# Patient Record
Sex: Female | Born: 1965 | Race: White | Hispanic: No | Marital: Married | Smoking: Never smoker
Health system: Southern US, Community
[De-identification: ages and names within clinical notes are randomized; demographics above are authoritative.]

## PROBLEM LIST (undated history)

## (undated) DIAGNOSIS — I10 Essential (primary) hypertension: Secondary | ICD-10-CM

## (undated) DIAGNOSIS — E119 Type 2 diabetes mellitus without complications: Secondary | ICD-10-CM

## (undated) DIAGNOSIS — F319 Bipolar disorder, unspecified: Secondary | ICD-10-CM

## (undated) DIAGNOSIS — E785 Hyperlipidemia, unspecified: Secondary | ICD-10-CM

## (undated) DIAGNOSIS — K219 Gastro-esophageal reflux disease without esophagitis: Secondary | ICD-10-CM

## (undated) DIAGNOSIS — I639 Cerebral infarction, unspecified: Secondary | ICD-10-CM

## (undated) DIAGNOSIS — E282 Polycystic ovarian syndrome: Secondary | ICD-10-CM

## (undated) HISTORY — DX: Bipolar disorder, unspecified: F31.9

## (undated) HISTORY — DX: Essential (primary) hypertension: I10

## (undated) HISTORY — DX: Polycystic ovarian syndrome: E28.2

## (undated) HISTORY — PX: ABDOMINAL HYSTERECTOMY: SHX81

## (undated) HISTORY — PX: LAPAROTOMY: SHX154

## (undated) HISTORY — DX: Type 2 diabetes mellitus without complications: E11.9

## (undated) HISTORY — DX: Gastro-esophageal reflux disease without esophagitis: K21.9

## (undated) HISTORY — DX: Cerebral infarction, unspecified: I63.9

## (undated) HISTORY — DX: Hyperlipidemia, unspecified: E78.5

---

## 2007-11-12 LAB — HM MAMMOGRAPHY: HM Mammogram: NORMAL

## 2010-01-16 ENCOUNTER — Ambulatory Visit: Payer: Self-pay | Admitting: Family Medicine

## 2010-01-16 DIAGNOSIS — E785 Hyperlipidemia, unspecified: Secondary | ICD-10-CM

## 2010-01-16 DIAGNOSIS — I10 Essential (primary) hypertension: Secondary | ICD-10-CM | POA: Insufficient documentation

## 2010-01-16 DIAGNOSIS — F319 Bipolar disorder, unspecified: Secondary | ICD-10-CM

## 2010-02-05 ENCOUNTER — Encounter: Payer: Self-pay | Admitting: Family Medicine

## 2010-02-13 ENCOUNTER — Ambulatory Visit (HOSPITAL_COMMUNITY): Payer: Self-pay | Admitting: Licensed Clinical Social Worker

## 2010-02-27 ENCOUNTER — Ambulatory Visit: Payer: Self-pay | Admitting: Family Medicine

## 2010-02-27 DIAGNOSIS — K219 Gastro-esophageal reflux disease without esophagitis: Secondary | ICD-10-CM

## 2010-02-27 DIAGNOSIS — E538 Deficiency of other specified B group vitamins: Secondary | ICD-10-CM

## 2010-03-04 ENCOUNTER — Ambulatory Visit: Payer: Self-pay | Admitting: Family Medicine

## 2010-03-05 ENCOUNTER — Telehealth: Payer: Self-pay | Admitting: Family Medicine

## 2010-03-05 ENCOUNTER — Encounter: Payer: Self-pay | Admitting: Family Medicine

## 2010-03-05 LAB — CONVERTED CEMR LAB
Eosinophils Absolute: 0.3 10*3/uL (ref 0.0–0.7)
Eosinophils Relative: 3 % (ref 0–5)
Hemoglobin: 10.8 g/dL — ABNORMAL LOW (ref 12.0–15.0)
Lymphocytes Relative: 20 % (ref 12–46)
Lymphs Abs: 1.9 10*3/uL (ref 0.7–4.0)
MCHC: 30.7 g/dL (ref 30.0–36.0)
Monocytes Absolute: 1 10*3/uL (ref 0.1–1.0)
Monocytes Relative: 10 % (ref 3–12)
RDW: 14.5 % (ref 11.5–15.5)

## 2010-03-11 ENCOUNTER — Ambulatory Visit: Payer: Self-pay | Admitting: Obstetrics & Gynecology

## 2010-03-11 ENCOUNTER — Ambulatory Visit: Payer: Self-pay | Admitting: Family Medicine

## 2010-03-11 ENCOUNTER — Telehealth: Payer: Self-pay | Admitting: Family Medicine

## 2010-03-12 ENCOUNTER — Encounter: Payer: Self-pay | Admitting: Family Medicine

## 2010-03-13 ENCOUNTER — Ambulatory Visit (HOSPITAL_COMMUNITY): Payer: Self-pay | Admitting: Licensed Clinical Social Worker

## 2010-03-16 ENCOUNTER — Encounter: Admission: RE | Admit: 2010-03-16 | Discharge: 2010-03-16 | Payer: Self-pay | Admitting: Obstetrics & Gynecology

## 2010-03-20 ENCOUNTER — Ambulatory Visit (HOSPITAL_COMMUNITY): Payer: Self-pay | Admitting: Licensed Clinical Social Worker

## 2010-03-25 ENCOUNTER — Ambulatory Visit: Payer: Self-pay | Admitting: Family Medicine

## 2010-03-25 ENCOUNTER — Ambulatory Visit: Payer: Self-pay | Admitting: Obstetrics & Gynecology

## 2010-03-27 ENCOUNTER — Ambulatory Visit (HOSPITAL_COMMUNITY): Payer: Self-pay | Admitting: Licensed Clinical Social Worker

## 2010-04-03 ENCOUNTER — Ambulatory Visit (HOSPITAL_COMMUNITY): Payer: Self-pay | Admitting: Licensed Clinical Social Worker

## 2010-04-09 ENCOUNTER — Ambulatory Visit: Payer: Self-pay | Admitting: Family Medicine

## 2010-04-10 ENCOUNTER — Ambulatory Visit (HOSPITAL_COMMUNITY): Payer: Self-pay | Admitting: Licensed Clinical Social Worker

## 2010-04-15 ENCOUNTER — Encounter: Payer: Self-pay | Admitting: Family Medicine

## 2010-04-15 ENCOUNTER — Ambulatory Visit: Payer: Self-pay | Admitting: Obstetrics & Gynecology

## 2010-04-16 LAB — CONVERTED CEMR LAB
ALT: 30 units/L (ref 0–35)
Albumin: 4.3 g/dL (ref 3.5–5.2)
Alkaline Phosphatase: 178 units/L — ABNORMAL HIGH (ref 39–117)
BUN: 9 mg/dL (ref 6–23)
Calcium: 9.3 mg/dL (ref 8.4–10.5)
Cholesterol: 267 mg/dL — ABNORMAL HIGH (ref 0–200)
Creatinine, Ser: 0.68 mg/dL (ref 0.40–1.20)
Glucose, Bld: 170 mg/dL — ABNORMAL HIGH (ref 70–99)
HCT: 37.7 % (ref 36.0–46.0)
MCHC: 30.8 g/dL (ref 30.0–36.0)
Platelets: 358 10*3/uL (ref 150–400)
Potassium: 3.6 meq/L (ref 3.5–5.3)
RBC: 4.57 M/uL (ref 3.87–5.11)
TIBC: 436 ug/dL (ref 250–470)
TSH: 3.619 microintl units/mL (ref 0.350–4.500)
Total Bilirubin: 0.4 mg/dL (ref 0.3–1.2)
Total CHOL/HDL Ratio: 6.8
Triglycerides: 549 mg/dL — ABNORMAL HIGH (ref <150)
UIBC: 397 ug/dL

## 2010-04-20 ENCOUNTER — Ambulatory Visit (HOSPITAL_COMMUNITY): Payer: Self-pay | Admitting: Psychiatry

## 2010-04-27 ENCOUNTER — Ambulatory Visit: Payer: Self-pay | Admitting: Family Medicine

## 2010-04-28 ENCOUNTER — Encounter: Payer: Self-pay | Admitting: Family Medicine

## 2010-04-28 LAB — CONVERTED CEMR LAB
AST: 48 units/L — ABNORMAL HIGH (ref 0–37)
Alkaline Phosphatase: 185 units/L — ABNORMAL HIGH (ref 39–117)
BUN: 10 mg/dL (ref 6–23)
CO2: 23 meq/L (ref 19–32)
Calcium: 10.1 mg/dL (ref 8.4–10.5)
Chloride: 96 meq/L (ref 96–112)
Glucose, Bld: 154 mg/dL — ABNORMAL HIGH (ref 70–99)
HCT: 40.1 % (ref 36.0–46.0)
Lymphocytes Relative: 17 % (ref 12–46)
Neutrophils Relative %: 72 % (ref 43–77)
Platelets: 359 10*3/uL (ref 150–400)
Potassium: 3.7 meq/L (ref 3.5–5.3)
RBC: 4.82 M/uL (ref 3.87–5.11)
RDW: 15.7 % — ABNORMAL HIGH (ref 11.5–15.5)
Sodium: 137 meq/L (ref 135–145)
Vitamin B-12: 1331 pg/mL — ABNORMAL HIGH (ref 211–911)
WBC: 12.2 10*3/uL — ABNORMAL HIGH (ref 4.0–10.5)

## 2010-04-30 LAB — CONVERTED CEMR LAB
Hep B C IgM: NEGATIVE
Hepatitis B Surface Ag: NEGATIVE
Hgb A1c MFr Bld: 6.9 % — ABNORMAL HIGH (ref <5.7)

## 2010-05-01 ENCOUNTER — Ambulatory Visit (HOSPITAL_COMMUNITY): Payer: Self-pay | Admitting: Licensed Clinical Social Worker

## 2010-05-04 ENCOUNTER — Ambulatory Visit: Payer: Self-pay | Admitting: Obstetrics & Gynecology

## 2010-05-04 ENCOUNTER — Ambulatory Visit (HOSPITAL_COMMUNITY): Admission: RE | Admit: 2010-05-04 | Discharge: 2010-05-05 | Payer: Self-pay | Admitting: Obstetrics & Gynecology

## 2010-05-08 ENCOUNTER — Ambulatory Visit (HOSPITAL_COMMUNITY): Payer: Self-pay | Admitting: Licensed Clinical Social Worker

## 2010-05-08 ENCOUNTER — Ambulatory Visit: Payer: Self-pay | Admitting: Family Medicine

## 2010-05-08 DIAGNOSIS — E119 Type 2 diabetes mellitus without complications: Secondary | ICD-10-CM

## 2010-05-08 DIAGNOSIS — R740 Nonspecific elevation of levels of transaminase and lactic acid dehydrogenase [LDH]: Secondary | ICD-10-CM

## 2010-05-08 LAB — CONVERTED CEMR LAB: LDL Goal: 100 mg/dL

## 2010-05-11 LAB — CONVERTED CEMR LAB
ALT: 37 units/L — ABNORMAL HIGH (ref 0–35)
AST: 43 units/L — ABNORMAL HIGH (ref 0–37)
Bilirubin, Direct: 0.1 mg/dL (ref 0.0–0.3)
Indirect Bilirubin: 0.4 mg/dL (ref 0.0–0.9)
Total Bilirubin: 0.5 mg/dL (ref 0.3–1.2)

## 2010-05-12 ENCOUNTER — Encounter: Admission: RE | Admit: 2010-05-12 | Discharge: 2010-05-12 | Payer: Self-pay | Admitting: Family Medicine

## 2010-05-12 ENCOUNTER — Telehealth: Payer: Self-pay | Admitting: Family Medicine

## 2010-05-13 ENCOUNTER — Telehealth: Payer: Self-pay | Admitting: Family Medicine

## 2010-05-22 ENCOUNTER — Telehealth: Payer: Self-pay | Admitting: Family Medicine

## 2010-05-26 ENCOUNTER — Telehealth: Payer: Self-pay | Admitting: Family Medicine

## 2010-05-29 ENCOUNTER — Telehealth: Payer: Self-pay | Admitting: Family Medicine

## 2010-05-29 ENCOUNTER — Ambulatory Visit: Payer: Self-pay | Admitting: Family Medicine

## 2010-05-29 DIAGNOSIS — L270 Generalized skin eruption due to drugs and medicaments taken internally: Secondary | ICD-10-CM | POA: Insufficient documentation

## 2010-06-09 ENCOUNTER — Telehealth: Payer: Self-pay | Admitting: Family Medicine

## 2010-06-09 ENCOUNTER — Ambulatory Visit: Payer: Self-pay | Admitting: Family Medicine

## 2010-06-10 ENCOUNTER — Encounter: Payer: Self-pay | Admitting: Family Medicine

## 2010-06-12 ENCOUNTER — Ambulatory Visit (HOSPITAL_COMMUNITY): Payer: Self-pay | Admitting: Psychiatry

## 2010-06-23 ENCOUNTER — Telehealth: Payer: Self-pay | Admitting: Family Medicine

## 2010-09-25 ENCOUNTER — Ambulatory Visit
Admission: RE | Admit: 2010-09-25 | Discharge: 2010-09-25 | Payer: Self-pay | Source: Home / Self Care | Attending: Family Medicine | Admitting: Family Medicine

## 2010-09-25 ENCOUNTER — Encounter
Admission: RE | Admit: 2010-09-25 | Discharge: 2010-09-25 | Payer: Self-pay | Source: Home / Self Care | Attending: Family Medicine | Admitting: Family Medicine

## 2010-09-25 DIAGNOSIS — M25519 Pain in unspecified shoulder: Secondary | ICD-10-CM | POA: Insufficient documentation

## 2010-09-25 DIAGNOSIS — R209 Unspecified disturbances of skin sensation: Secondary | ICD-10-CM | POA: Insufficient documentation

## 2010-09-28 ENCOUNTER — Telehealth: Payer: Self-pay | Admitting: Family Medicine

## 2010-10-02 ENCOUNTER — Ambulatory Visit (HOSPITAL_COMMUNITY)
Admission: RE | Admit: 2010-10-02 | Discharge: 2010-10-02 | Payer: Self-pay | Source: Home / Self Care | Attending: Psychiatry | Admitting: Psychiatry

## 2010-10-13 ENCOUNTER — Telehealth: Payer: Self-pay | Admitting: Family Medicine

## 2010-10-17 ENCOUNTER — Encounter: Admission: RE | Admit: 2010-10-17 | Payer: Self-pay | Source: Home / Self Care | Admitting: Family Medicine

## 2010-10-20 NOTE — Assessment & Plan Note (Signed)
Summary: 2 mo. f/u DM, liver   Vital Signs:  Patient profile:   45 year old female Height:      62.75 inches Weight:      203 pounds Pulse rate:   69 / minute BP sitting:   139 / 88  (left arm) Cuff size:   regular  Vitals Entered By: Avon Gully CMA, Duncan Dull) (May 08, 2010 2:14 PM) CC: f/u labs. , Lipid Management   Primary Care Provider:  Nani Gasser MD  CC:  f/u labs.  and Lipid Management.  History of Present Illness: Bought her own meter and started testing.  Noticed fasting in the AM is usually below 170 but occ low 200s.  After eats tends to dip and then 2 hours later it is really high. Has breen limiting her carbs even with this.Would feel nauseated when wakes up in the AM.  Had a couple of sugars in the 350 range.     Occ will get a apinch or stich on her side on her right flank near the RUQ.  Usually pian is breif. Has noticed some bloating. Did have her endometrial ablation 4 days ago and this went well.  Started after she found out her liver enzymes were slightly elevated.   Lipid Management History:      Positive NCEP/ATP III risk factors include diabetes, HDL cholesterol less than 40, and hypertension.  Negative NCEP/ATP III risk factors include female age less than 35 years old and non-tobacco-user status.    Current Medications (verified): 1)  Lamotrigine 100 Mg Tabs (Lamotrigine) .... Take 1 Tab By Mouth Two Times A Day 2)  Lorazepam 1 Mg Tabs (Lorazepam) .... Take 1-2 Tabs By Mouth At Bedtime 3)  Oxcarbazepine 300 Mg Tabs (Oxcarbazepine) .... Take 1 Tab By Mouth Two Times A Day 4)  Abilify 10 Mg Tabs (Aripiprazole) .... Take 1 Tab By Mouth Once Daily 5)  Melatonin 5 Mg Tabs (Melatonin) .... Take 2 Tabs By Mouth At Bedtime 6)  Norvasc 10 Mg Tabs (Amlodipine Besylate) .Marland Kitchen.. 1 Tab By Mouth Once A Day 7)  Prilosec 20 Mg Cpdr (Omeprazole) .... Take One Tablet By Mouth Twice A Day 8)  Maxzide-25 37.5-25 Mg Tabs (Triamterene-Hctz) .Marland Kitchen.. 1 Tab By Mouth  Daily 9)  Metoprolol Succinate 50 Mg Xr24h-Tab (Metoprolol Succinate) .Marland Kitchen.. 1 Tab By Mouth Daily 10)  Vitamin B-12 2500 Mcg Subl (Cyanocobalamin) .... Take One Tabelt By Mouth Once A Day  Allergies (verified): No Known Drug Allergies  Comments:  Nurse/Medical Assistant: The patient's medications and allergies were reviewed with the patient and were updated in the Medication and Allergy Lists. Avon Gully CMA, Duncan Dull) (May 08, 2010 2:24 PM)  Physical Exam  General:  Well-developed,well-nourished,in no acute distress; alert,appropriate and cooperative throughout examination Lungs:  Normal respiratory effort, chest expands symmetrically. Lungs are clear to auscultation, no crackles or wheezes. Heart:  Normal rate and regular rhythm. S1 and S2 normal without gallop, murmur, click, rub or other extra sounds.   Impression & Recommendations:  Problem # 1:  DIABETES MELLITUS, CONTROLLED (ICD-250.00) Assessment New  Discussed new dx. Discussed medications and potential SE.  F/U in 3-4 weeks. Will refer for counseling.  Recheck microalbumin once A1C is under control.  Can discuss vaccination etc at the next visit.   Her updated medication list for this problem includes:    Glucophage 500 Mg Tabs (Metformin hcl) .Marland Kitchen... Take 1 tablet by mouth two times a day  Orders: Nutrition Referral (Nutrition) Creatinine  ([address]) Urine  Microalbumin (04540)  Problem # 2:  TRANSAMINASES, SERUM, ELEVATED (ICD-790.4) Assessment: New  Acute Hepal panel neg. Likely from fatty liver but will recheck toay and if still elevated consider [place] imagian of the liver.  Can also assess the GB at that time.   Orders: T-Hepatic Function [phone])  Complete Medication List: 1)  Lamotrigine 100 Mg Tabs (Lamotrigine) .... Take 1 tab by mouth two times a day 2)  Lorazepam 1 Mg Tabs (Lorazepam) .... Take 1-2 tabs by mouth at bedtime 3)  Oxcarbazepine 300 Mg Tabs (Oxcarbazepine) .... Take 1 tab by mouth two  times a day 4)  Abilify 10 Mg Tabs (Aripiprazole) .... Take 1 tab by mouth once daily 5)  Melatonin 5 Mg Tabs (Melatonin) .... Take 2 tabs by mouth at bedtime 6)  Norvasc 10 Mg Tabs (Amlodipine besylate) .Marland Kitchen.. 1 tab by mouth once a day 7)  Prilosec 20 Mg Cpdr (Omeprazole) .... Take one tablet by mouth twice a day 8)  Maxzide-25 37.5-25 Mg Tabs (Triamterene-hctz) .Marland Kitchen.. 1 tab by mouth daily 9)  Metoprolol Succinate 50 Mg Xr24h-tab (Metoprolol succinate) .Marland Kitchen.. 1 tab by mouth daily 10)  Vitamin B-12 2500 Mcg Subl (Cyanocobalamin) .... Take one tabelt by mouth once a day 11)  Glucophage 500 Mg Tabs (Metformin hcl) .... Take 1 tablet by mouth two times a day  Lipid Assessment/Plan:      Based on NCEP/ATP III, the patient's risk factor category is "history of diabetes".  The patient's lipid goals are as follows: Total cholesterol goal is 200; LDL cholesterol goal is 100; HDL cholesterol goal is 40; Triglyceride goal is 150.     Patient Instructions: 1)  We will refer you for diabetic counseling  2)  F/U in 3-4 weeks.  Prescriptions: HYDROCODONE-ACETAMINOPHEN 5-325 MG TABS (HYDROCODONE-ACETAMINOPHEN) 1-2 tabs every 4-6 hours as needed  #10 x 0   Entered and Authorized by:   Nani Gasser MD   Signed by:   Nani Gasser MD on 05/08/2010   Method used:   Print then Give to Patient   RxID:   [id number] GLUCOPHAGE 500 MG TABS (METFORMIN HCL) Take 1 tablet by mouth two times a day  #60 x 0   Entered and Authorized by:   Nani Gasser MD   Signed by:   Nani Gasser MD on 05/08/2010   Method used:   Electronically to        [facility]* (retail)       [address] [place], [place]  [address]       Ph: [phone]       Fax: [phone]   RxID:   [id number]   Laboratory Results   Urine Tests  Date/Time Received: 05/08/10 Date/Time Reported: 05/08/10  Microalbumin (urine): 150 mg/L Creatinine: 300 mg/dL  A:C Ratio 29-$[id number]$ /g    The hydrocodone  above was in error and was canceld. August 19, 20116:57 PM Metheney MD, Santina Evans

## 2010-10-20 NOTE — Assessment & Plan Note (Signed)
Summary: NURSE--BP  Nurse Visit   Vital Signs:  Patient profile:   45 year old female Height:      62.75 inches Weight:      217 pounds Pulse rate:   94 / minute BP sitting:   177 / 115  (left arm) Cuff size:   large  Vitals Entered By: Kathlene November (March 11, 2010 1:07 PM) CC: Follow-up HTN HPI: Taking Meds?no Side Effects?no Chest Pain, SOB, Dizziness?no A/P: HTN(401.1) At Goal? If no, MD will be notified. Follow-up in--  5 minutes was spent with the pt.  Allergies: No Known Drug Allergies  Orders Added: 1)  Est. Patient Level I [[address]] Prescriptions: MAXZIDE-25 37.5-25 MG TABS (TRIAMTERENE-HCTZ) 1 tab by mouth daily  #30 x 2   Entered and Authorized by:   Seymour Bars DO   Signed by:   Seymour Bars DO on 03/11/2010   Method used:   Electronically to        [facility]* (retail)       [address] [place], [place]  [address]       Ph: [phone]       Fax: [phone]   RxID:   [id number] NORVASC 10 MG TABS (AMLODIPINE BESYLATE) 1 tab by mouth once a day  #30 x 2   Entered and Authorized by:   Seymour Bars DO   Signed by:   Seymour Bars DO on 03/11/2010   Method used:   Electronically to        [facility]* (retail)       [address] [place], [place]  [address]       Ph: [phone]       Fax: [phone]   RxID:   [id number]     Patient Instructions: 1)  Off Tribenzor, BP is HIGH. 2)  Will change to MAXZIDE once a day + AMLODOPINE once a day. 3)  Return for a nurse visit BP check in 2 wks. 4)  STOP POTASSIUM PILLS!

## 2010-10-20 NOTE — Assessment & Plan Note (Signed)
Summary: Rash   Vital Signs:  Patient profile:   45 year old female Height:      62.75 inches Weight:      202 pounds Pulse rate:   90 / minute BP sitting:   140 / 96  (right arm) Cuff size:   regular  Vitals Entered By: Avon Gully CMA, Duncan Dull) (May 29, 2010 3:37 PM) CC: f/u DM, stopped the [place] because pt started itching,still itching all over   Primary Care Provider:  Nani Gasser MD  CC:  f/u DM, stopped the [place] because pt started itching, and still itching all over.  History of Present Illness: f/u DM, stopped the [place] because pt started itching, till itching all over. Started about a week after started the Januvia.  Also started her on zoloft on 05-06-2010.  Off the Januvia for 7 days.  Getting a red raised rash on her abdomen is very itchy. Using antihistamine -does help but as soon as wears off it starts again.  She is taking Victoza in the morning.  No SOB or wheezing. Pt 's husband took a photo that she brings in with her today.   Current Medications (verified): 1)  Lamotrigine 100 Mg Tabs (Lamotrigine) .... Take 1 Tab By Mouth Two Times A Day 2)  Lorazepam 1 Mg Tabs (Lorazepam) .... Take 1-2 Tabs By Mouth At Bedtime 3)  Oxcarbazepine 300 Mg Tabs (Oxcarbazepine) .... Take 1 Tab By Mouth Two Times A Day 4)  Melatonin 5 Mg Tabs (Melatonin) .... Take 2 Tabs By Mouth At Bedtime 5)  Norvasc 10 Mg Tabs (Amlodipine Besylate) .Marland Kitchen.. 1 Tab By Mouth Once A Day 6)  Prilosec 20 Mg Cpdr (Omeprazole) .... Take One Tablet By Mouth Twice A Day 7)  Maxzide-25 37.5-25 Mg Tabs (Triamterene-Hctz) .Marland Kitchen.. 1 Tab By Mouth Daily 8)  Metoprolol Succinate 50 Mg Xr24h-Tab (Metoprolol Succinate) .Marland Kitchen.. 1 Tab By Mouth Daily 9)  Victoza 18 Mg/31ml Soln (Liraglutide) .... 0.6 Mg Subcutaneously Daily X 1 Week, Then Incerase To 1.2 Mg Subcutaneously Daily 10)  Zoloft 25 Mg Tabs (Sertraline Hcl) .... One Tablet By Mouth Once A Day  Allergies (verified): 1)  ! Metformin Hcl (Metformin  Hcl)  Comments:  Nurse/Medical Assistant: The patient's medications and allergies were reviewed with the patient and were updated in the Medication and Allergy Lists. Avon Gully CMA, Duncan Dull) (May 29, 2010 3:39 PM)  Physical Exam  General:  Well-developed,well-nourished,in no acute distress; alert,appropriate and cooperative throughout examination Lungs:  Normal respiratory effort, chest expands symmetrically. Lungs are clear to auscultation, no crackles or wheezes. Heart:  Normal rate and regular rhythm. S1 and S2 normal without gallop, murmur, click, rub or other extra sounds. Skin:  Posterior upper arms with erythematous 1 cm papules.  Some excoriations.  Psych:  Cognition and judgment appear intact. Alert and cooperative with normal attention span and concentration. No apparent delusions, illusions, hallucinations   Impression & Recommendations:  Problem # 1:  CUTANEOUS ERUPTIONS, DRUG-INDUCED (ICD-693.0) Assessment New Based on photo, hx and exam likel Drug reaction. Will have her stop the victoza and the zoloft. Wean zoloft over one week if would like and please let Dr. Christell Constant (psych) know.  After rash completley clears then restart teh [place], as insurance will likely not pay for victoza.  If no rash in 48 hours then can restart the zoloft. If rash recurs then we know it is the zoloft. If not then it may be the zoloft. can use antihistamines in the interim for  relief since they does seeme to help with her urticaria.    Problem # 2:  DIABETES MELLITUS, CONTROLLED (ICD-250.00) See above. Hold the victoza.  Her updated medication list for this problem includes:    Victoza 18 Mg/77ml Soln (Liraglutide) .Marland Kitchen... 0.6 mg subcutaneously daily x 1 week, then incerase to 1.2 mg subcutaneously daily  Complete Medication List: 1)  Lamotrigine 100 Mg Tabs (Lamotrigine) .... Take 1 tab by mouth two times a day 2)  Lorazepam 1 Mg Tabs (Lorazepam) .... Take 1-2 tabs by mouth at bedtime 3)   Oxcarbazepine 300 Mg Tabs (Oxcarbazepine) .... Take 1 tab by mouth two times a day 4)  Melatonin 5 Mg Tabs (Melatonin) .... Take 2 tabs by mouth at bedtime 5)  Norvasc 10 Mg Tabs (Amlodipine besylate) .Marland Kitchen.. 1 tab by mouth once a day 6)  Prilosec 20 Mg Cpdr (Omeprazole) .... Take one tablet by mouth twice a day 7)  Maxzide-25 37.5-25 Mg Tabs (Triamterene-hctz) .Marland Kitchen.. 1 tab by mouth daily 8)  Metoprolol Succinate 50 Mg Xr24h-tab (Metoprolol succinate) .Marland Kitchen.. 1 tab by mouth daily 9)  Victoza 18 Mg/67ml Soln (Liraglutide) .... 0.6 mg subcutaneously daily x 1 week, then incerase to 1.2 mg subcutaneously daily 10)  Zoloft 25 Mg Tabs (Sertraline hcl) .... One tablet by mouth once a day  Patient Instructions: 1)  Take teh zoloft every every day for one week then stop.  2)  Hold the [place] and the victoza. once the rash resolves then restart the [place]. If not rash after 48 hours then can try to restart the zoloft. If rash recurs in 48 hours then the zoloft.  3)  Let Dr. Christell Constant know what we are doing.

## 2010-10-20 NOTE — Medication Information (Signed)
Summary: Denial for Victoza/[employer] [place]  Denial for Victoza/[employer] [place]   Imported By: Lanelle Bal 06/09/2010 12:54:20  _____________________________________________________________________  External Attachment:    Type:   Image     Comment:   External Document

## 2010-10-20 NOTE — Medication Information (Signed)
Summary: [facility] [employer] [facility] the [place]   Imported By: Lanelle Bal 06/22/2010 10:27:06  _____________________________________________________________________  External Attachment:    Type:   Image     Comment:   External Document

## 2010-10-20 NOTE — Assessment & Plan Note (Signed)
Summary: DRY MOUTH, pruritis   Vital Signs:  Patient profile:   45 year old female Height:      62.75 inches Weight:      205 pounds Pulse rate:   101 / minute BP sitting:   148 / 95  (left arm) Cuff size:   large  Vitals Entered By: Kathlene November (April 27, 2010 12:57 PM) CC: dry mouth for 3 weeks. Difficulty eating or swallowing due to decreased saliva. no taste and itching all over   Primary Care Provider:  Nani Gasser MD  CC:  dry mouth for 3 weeks. Difficulty eating or swallowing due to decreased saliva. no taste and itching all over.  History of Present Illness: dry mouth for 3 weeks. Difficulty eating or swallowing due to decreased saliva. no taste and itching all over. Can't taste anything for the last 3-4 days.  No fever or nasal drainage.  Notices some yellow drainage in her throat.  Diffuse itching.  Interfering with her sleep. Had itching before when had low iron but iron is normal. No changes on her recent medication. No facial pain or ST.  NO HA.  Slight nauseated. No vomiting. Having surgery next week and is nervous taht she may have an infection.   Current Medications (verified): 1)  Lamotrigine 100 Mg Tabs (Lamotrigine) .... Take 1 Tab By Mouth Two Times A Day 2)  Lorazepam 1 Mg Tabs (Lorazepam) .... Take 1-2 Tabs By Mouth At Bedtime 3)  Oxcarbazepine 300 Mg Tabs (Oxcarbazepine) .... Take 1 Tab By Mouth Two Times A Day 4)  Abilify 10 Mg Tabs (Aripiprazole) .... Take 1 Tab By Mouth Once Daily 5)  Melatonin 5 Mg Tabs (Melatonin) .... Take 2 Tabs By Mouth At Bedtime 6)  Norvasc 10 Mg Tabs (Amlodipine Besylate) .Marland Kitchen.. 1 Tab By Mouth Once A Day 7)  Prilosec 20 Mg Cpdr (Omeprazole) .... Take One Tablet By Mouth Twice A Day 8)  Maxzide-25 37.5-25 Mg Tabs (Triamterene-Hctz) .Marland Kitchen.. 1 Tab By Mouth Daily 9)  Metoprolol Succinate 50 Mg Xr24h-Tab (Metoprolol Succinate) .Marland Kitchen.. 1 Tab By Mouth Daily 10)  Vitamin B-12 2500 Mcg Subl (Cyanocobalamin) .... Take One Tabelt By Mouth Once  A Day  Allergies (verified): No Known Drug Allergies  Comments:  Nurse/Medical Assistant: The patient's medications and allergies were reviewed with the patient and were updated in the Medication and Allergy Lists. Kathlene November (April 27, 2010 12:59 PM)  Physical Exam  General:  Well-developed,well-nourished,in no acute distress; alert,appropriate and cooperative throughout examination Head:  Normocephalic and atraumatic without obvious abnormalities. No apparent alopecia or balding. Eyes:  No corneal or conjunctival inflammation noted. EOMI. Perrla.  Ears:  External ear exam shows no significant lesions or deformities.  Otoscopic examination reveals clear canals, tympanic membranes are intact bilaterally without bulging, retraction, inflammation or discharge. Hearing is grossly normal bilaterally. Nose:  External nasal examination shows no deformity or inflammation. Nasal mucosa are pink and moist without lesions or exudates. Mouth:  Oral mucosa and oropharynx without lesions or exudates.  Mucosa is dry.  Neck:  No deformities, masses, or tenderness noted. Lungs:  Normal respiratory effort, chest expands symmetrically. Lungs are clear to auscultation, no crackles or wheezes. Heart:  Normal rate and regular rhythm. S1 and S2 normal without gallop, murmur, click, rub or other extra sounds. Skin:  no rashes.   Cervical Nodes:  No lymphadenopathy noted Psych:  Cognition and judgment appear intact. Alert and cooperative with normal attention span and concentration. No apparent delusions, illusions,  hallucinations   Impression & Recommendations:  Problem # 1:  DISTURBANCE OF SALIVARY SECRETION (ICD-527.7) Unclear etiology. Thought explained that she is on several medications that can cause Dry mouth and ithcing.  For now cut the fluid pill in half and stop the allergy med. Will get a CBC and CMP. Will call iwth th eresults.  Orders: T-CBC w/Diff ([id number]) T-Comprehensive Metabolic  Panel ([id number]) T-Vitamin B12 [phone]) T- * Misc. Laboratory test [phone])  Problem # 2:  PRURITUS (ICD-698.9) Check CMP to rule out hepatitis which is a rare SE of the metoprolol which is a new medication for her. Started it about one mo ago.  Orders: T-CBC w/Diff ([id number]) T-Comprehensive Metabolic Panel [id number]) T-Vitamin B12 [phone]) T- * Misc. Laboratory test ([id number])  Complete Medication List: 1)  Lamotrigine 100 Mg Tabs (Lamotrigine) .... Take 1 tab by mouth two times a day 2)  Lorazepam 1 Mg Tabs (Lorazepam) .... Take 1-2 tabs by mouth at bedtime 3)  Oxcarbazepine 300 Mg Tabs (Oxcarbazepine) .... Take 1 tab by mouth two times a day 4)  Abilify 10 Mg Tabs (Aripiprazole) .... Take 1 tab by mouth once daily 5)  Melatonin 5 Mg Tabs (Melatonin) .... Take 2 tabs by mouth at bedtime 6)  Norvasc 10 Mg Tabs (Amlodipine besylate) .Marland Kitchen.. 1 tab by mouth once a day 7)  Prilosec 20 Mg Cpdr (Omeprazole) .... Take one tablet by mouth twice a day 8)  Maxzide-25 37.5-25 Mg Tabs (Triamterene-hctz) .Marland Kitchen.. 1 tab by mouth daily 9)  Metoprolol Succinate 50 Mg Xr24h-tab (Metoprolol succinate) .Marland Kitchen.. 1 tab by mouth daily 10)  Vitamin B-12 2500 Mcg Subl (Cyanocobalamin) .... Take one tabelt by mouth once a day  Patient Instructions: 1)  Cut your fluid pill in half.   2)  We will call you with your lab results. 3)   Stop the allergy pill.

## 2010-10-20 NOTE — Progress Notes (Signed)
Summary: allergic reaction  Phone Note Call from Patient   Caller: Patient Call For: Nani Gasser MD Summary of Call: Pt called and states she may be having a reaction to the Junuvia. Pt states she has been feeling itchy for the past week but last night she developed lumps under the skin after  taking it and she got itchy. the "lumps are just on the rt side of her body. Pt took anthistimines and the itching stopped.please advise Initial call taken by: Avon Gully CMA, Duncan Dull),  May 22, 2010 1:11 PM  Follow-up for Phone Call        OK stop the [place] and see if rash and ithcing resides in the next couple of days.  If better on Monday then call the office and we can try to run the onglyza through insurance again.  Follow-up by: Nani Gasser MD,  May 22, 2010 1:36 PM  Additional Follow-up for Phone Call Additional follow up Details #1::        left message on pt's vm with above reccomendations [phone] Additional Follow-up by: Avon Gully CMA, Duncan Dull),  May 22, 2010 2:11 PM

## 2010-10-20 NOTE — Progress Notes (Signed)
Summary: Reaction  Phone Note Call from Patient   Caller: Patient Summary of Call: Pt called and states that she has seen you for a reaction and pt. was told to stop her diabetes meds and Zoloft and to take Zyrtec.... Patient states that she is getting worse.... Now she has Hives, welps and itching  and it stays all the time... pt. states the she is wanting Dr. Linford Arnold to know so she can come up with a plan and something has got to give her some relief since her symptoms are getting worse. Call patient back at [phone] as soon as you get a chance. Thanks Initial call taken by: Michaelle Copas,  June 09, 2010 9:38 AM  Follow-up for Phone Call        2 of her meds can cause stevens-johnson.  Needs appt Hold all meds this AM.  Follow-up by: Nani Gasser MD,  June 09, 2010 10:10 AM

## 2010-10-20 NOTE — Medication Information (Signed)
Summary: Approval for Tribenzor/[employer] for [employer] [place]   Imported By: Lanelle Bal 03/20/2010 10:07:22  _____________________________________________________________________  External Attachment:    Type:   Image     Comment:   External Document

## 2010-10-20 NOTE — Progress Notes (Signed)
Summary: meds  Phone Note Call from Patient   Caller: Patient Call For: Nani Gasser MD Summary of Call: pt called and wants to know if there is anothe alternative to the onglyza because she is concerned that is going to bee too expensive if the insurance doesnt cover it.ok with trying an injectable Initial call taken by: Avon Gully CMA, Duncan Dull),  May 26, 2010 4:57 PM  Follow-up for Phone Call        We can try byetta or vicotza which are injectables. If she has a preference can let me know.  Follow-up by: Nani Gasser MD,  May 26, 2010 5:05 PM  Additional Follow-up for Phone Call Additional follow up Details #1::        Pt states would like to try the Victoza because she has tried that in the past and did not have any side effects from it.  [facility] [address] in [place] Additional Follow-up by: Kathlene November,  May 27, 2010 9:16 AM    New/Updated Medications: VICTOZA 18 MG/3ML SOLN (LIRAGLUTIDE) 0.6 mg Subcutaneously daily x 1 week, then incerase to 1.2 mg Subcutaneously daily Prescriptions: VICTOZA 18 MG/3ML SOLN (LIRAGLUTIDE) 0.6 mg Subcutaneously daily x 1 week, then incerase to 1.2 mg Subcutaneously daily  #1 pen x 2   Entered and Authorized by:   Nani Gasser MD   Signed by:   Nani Gasser MD on 05/27/2010   Method used:   Electronically to        [facility] [phone]* (retail)       [address] [place], [place]  [address]       Ph: [phone] or [phone]       Fax: [phone]   RxID:   [id number]

## 2010-10-20 NOTE — Assessment & Plan Note (Signed)
Summary: preop exam   Vital Signs:  Patient profile:   45 year old female Height:      62.75 inches Weight:      209 pounds BMI:     37.45 O2 Sat:      96 % on Room air Temp:     98.6 degrees F oral Pulse rate:   102 / minute BP sitting:   139 / 92  (left arm) Cuff size:   large  Vitals Entered By: Payton Spark CMA (April 09, 2010 11:06 AM)  O2 Flow:  Room air CC: Surgical clearance for ablation.    Primary Care Provider:  Nani Gasser MD  CC:  Surgical clearance for ablation. Marland Kitchen  History of Present Illness: 45 yo WF presents for pre- op evaluation for upcoming endometrial ablation with Dr Jearld Lesch.  She has never had heart or lung problems.  She is not a smoker and has never had a problem with anesthesia.  Denies any bleeding d/o's or hx of sleep apnea.  She is due for fasting labs. She is due for an EKG.  Has never had one.    Her home BPs are 120s/ 80s.      Current Medications (verified): 1)  Lamotrigine 100 Mg Tabs (Lamotrigine) .... Take 1 Tab By Mouth Two Times A Day 2)  Ferralet 90 90-1 Mg Tabs (Fe Cbn-Fe Gluc-Fa-B12-C-Dss) .... Take 1 Tab By Mouth Once Daily 3)  Lorazepam 1 Mg Tabs (Lorazepam) .... Take 1-2 Tabs By Mouth At Bedtime 4)  Oxcarbazepine 300 Mg Tabs (Oxcarbazepine) .... Take 1 Tab By Mouth Two Times A Day 5)  Abilify 10 Mg Tabs (Aripiprazole) .... Take 1 Tab By Mouth Once Daily 6)  Melatonin 5 Mg Tabs (Melatonin) .... Take 2 Tabs By Mouth At Bedtime 7)  Norvasc 10 Mg Tabs (Amlodipine Besylate) .Marland Kitchen.. 1 Tab By Mouth Once A Day 8)  Prilosec 20 Mg Cpdr (Omeprazole) .... Take One Tablet By Mouth Twice A Day 9)  Naprosyn 500 Mg Tabs (Naproxen) .Marland Kitchen.. 1 Two Times A Day As Needed For Abd Cramps- Take W/ Food. 10)  Maxzide-25 37.5-25 Mg Tabs (Triamterene-Hctz) .Marland Kitchen.. 1 Tab By Mouth Daily 11)  Metoprolol Succinate 50 Mg Xr24h-Tab (Metoprolol Succinate) .Marland Kitchen.. 1 Tab By Mouth Daily  Allergies (verified): No Known Drug Allergies  Past History:  Past Medical  History: PCOS-- gyn Dr Penne Lash  Past Surgical History: Reviewed history from 01/16/2010 and no changes required. Laparotomy 1998  Family History: Reviewed history from 01/16/2010 and no changes required. Parents with DM, HTN, chol Mother with stroke  Social History: Reviewed history from 01/16/2010 and no changes required. 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   Never Smoked Alcohol use-no Drug use-no Regular exercise-no 1 caffeinate drink per day.   Review of Systems      See HPI  Physical Exam  General:  alert, well-developed, well-nourished, and well-hydrated.  obese in NAD Head:  normocephalic and atraumatic.   Eyes:  slight conjunctival pallor Ears:  no external deformities.   Nose:  no nasal discharge.   Mouth:  good dentition and pharynx pink and moist.  normal airway Neck:  no masses.   Lungs:  Normal respiratory effort, chest expands symmetrically. Lungs are clear to auscultation, no crackles or wheezes. Heart:  Normal rate and regular rhythm. S1 and S2 normal without gallop, murmur, click, rub or other extra sounds. Pulses:  2+ radial pulses Extremities:  no E/C/C Neurologic:  gait normal.   Skin:  color normal.   Cervical Nodes:  No lymphadenopathy noted Psych:  good eye contact, not anxious appearing, and not depressed appearing.     Impression & Recommendations:  Problem # 1:  PREOPERATIVE EXAMINATION (ICD-V72.84) Pre- op exam done, reviewing risk factors for anesthesia. BP high today but at goal at home.  RTC with home cuff to recheck in 1-2 wks. Update fasting labs since she is overdue. EKG today.  NSR with left axis deviation.  QTc 462- slightly prolonged.   Prepared pre op clearance letter for Dr Penne Lash for upcoming endometrial ablation.   Orders: EKG w/ Interpretation (93000)  Complete Medication List: 1)  Lamotrigine 100 Mg Tabs (Lamotrigine) .... Take 1 tab by mouth two times a  day 2)  Ferralet 90 90-1 Mg Tabs (Fe cbn-fe gluc-fa-b12-c-dss) .... Take 1 tab by mouth once daily 3)  Lorazepam 1 Mg Tabs (Lorazepam) .... Take 1-2 tabs by mouth at bedtime 4)  Oxcarbazepine 300 Mg Tabs (Oxcarbazepine) .... Take 1 tab by mouth two times a day 5)  Abilify 10 Mg Tabs (Aripiprazole) .... Take 1 tab by mouth once daily 6)  Melatonin 5 Mg Tabs (Melatonin) .... Take 2 tabs by mouth at bedtime 7)  Norvasc 10 Mg Tabs (Amlodipine besylate) .Marland Kitchen.. 1 tab by mouth once a day 8)  Prilosec 20 Mg Cpdr (Omeprazole) .... Take one tablet by mouth twice a day 9)  Naprosyn 500 Mg Tabs (Naproxen) .Marland Kitchen.. 1 two times a day as needed for abd cramps- take w/ food. 10)  Maxzide-25 37.5-25 Mg Tabs (Triamterene-hctz) .Marland Kitchen.. 1 tab by mouth daily 11)  Metoprolol Succinate 50 Mg Xr24h-tab (Metoprolol succinate) .Marland Kitchen.. 1 tab by mouth daily  Other Orders: T-CBC No Diff ([id number]) T-Comprehensive Metabolic Panel ([id number]) T-Lipid Profile (17616-07371) T-TSH [phone]) T-Vitamin B12 [phone]) T-Iron (18299-37169) T-Iron Binding Capacity (TIBC) ([address])  Patient Instructions: 1)  EKG today. 2)  Update fasting labs today. 3)  Return for a nurse visit BP check with your home BP cuff in 1-2 wks.

## 2010-10-20 NOTE — Assessment & Plan Note (Signed)
Summary: f/u HTN   Vital Signs:  Patient profile:   45 year old female Height:      62.75 inches Weight:      215 pounds O2 Sat:      98 % on Room air Temp:     98.9 degrees F oral Pulse rate:   109 / minute BP sitting:   165 / 100  (left arm) Cuff size:   large  Vitals Entered By: Kathlene November (February 27, 2010 3:28 PM)  O2 Flow:  Room air CC: followup- depression worse, itching, and needs labs   Primary Care Provider:  Nani Gasser MD  CC:  followup- depression worse, itching, and and needs labs.  History of Present Illness: 45 yo WF presents for f/u cough.  She had an EGD last month with Dr Jacqulyn Bath that showed reflux esophagitis with gastritis.  She is on Prilosec two times a day.  She has noticed some improvements and denies any dysphagia problems.  Her indigestion is better.    Her cough came back once she restarted lisinopril and was better on the  Tribenzor.  Claritin has helped her sinus HAs.    She has an appt for her Bipolar D/O with Dr Christell Constant and she is seeing Merlene Morse.    Current Medications (verified): 1)  Lamotrigine 100 Mg Tabs (Lamotrigine) .... Take 1 Tab By Mouth Two Times A Day 2)  Ferralet 90 90-1 Mg Tabs (Fe Cbn-Fe Gluc-Fa-B12-C-Dss) .... Take 1 Tab By Mouth Once Daily 3)  Lorazepam 1 Mg Tabs (Lorazepam) .... Take 1-2 Tabs By Mouth At Bedtime 4)  Oxcarbazepine 300 Mg Tabs (Oxcarbazepine) .... Take 1 Tab By Mouth Two Times A Day 5)  Abilify 10 Mg Tabs (Aripiprazole) .... Take 1 Tab By Mouth Once Daily 6)  Melatonin 5 Mg Tabs (Melatonin) .... Take 2 Tabs By Mouth At Bedtime 7)  Potassium Chloride Cr 10 Meq Cr-Caps (Potassium Chloride) .... Take 1 Cap By Mouth Once Daily 8)  Tribenzor 40-5-25 Mg Tabs (Olmesartan-Amlodipine-Hctz) .... Take 1 Tablet By Mouth Once A Day 9)  Prilosec 20 Mg Cpdr (Omeprazole) .... Take One Tablet By Mouth Twice A Day  Allergies (verified): No Known Drug Allergies  Comments:  Nurse/Medical Assistant: The patient's  medications and allergies were reviewed with the patient and were updated in the Medication and Allergy Lists. Kathlene November (February 27, 2010 3:30 PM)  Past History:  Past Medical History: Reviewed history from 01/16/2010 and no changes required. None  Social History: Reviewed history from 01/16/2010 and no changes required. 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   Never Smoked Alcohol use-no Drug use-no Regular exercise-no 1 caffeinate drink per day.   Review of Systems      See HPI  Physical Exam  General:  obese WF in NAD Head:  normocephalic and atraumatic.  normocephalic and atraumatic.   Neck:  no masses.  no masses.   Lungs:  Normal respiratory effort, chest expands symmetrically. Lungs are clear to auscultation, no crackles or wheezes. Heart:  Normal rate and regular rhythm. S1 and S2 normal without gallop, murmur, click, rub or other extra sounds. Skin:  color normal.  color normal.   Cervical Nodes:  No lymphadenopathy noted Psych:  good eye contact and not depressed appearing.      Impression & Recommendations:  Problem # 1:  ESOPHAGEAL REFLUX (ICD-530.81) Reviewed her EGD from DR Long in May showing erosive  gastropathy with grade B reflux, neg for H. pylori.  Continue Prilosec two times a day -- she has done rather well on it.  Advised weaning down to once a day in 3 wks. The following medications were removed from the medication list:    Ranitidine Hcl 75 Mg Tabs (Ranitidine hcl) .Marland Kitchen... Take as needed Her updated medication list for this problem includes:    Prilosec 20 Mg Cpdr (Omeprazole) .Marland Kitchen... Take one tablet by mouth twice a day  Problem # 2:  HYPERTENSION, BENIGN (ICD-401.1) Proved to have ACEi cough.  Will keep her on Tribenzor once a day. Update labs after last OV in 2-3 mos. The following medications were removed from the medication list:    Amlodipine Besylate 10 Mg Tabs (Amlodipine besylate)  .Marland Kitchen... Take 1 tab by mouth once daily    Lisinopril-hydrochlorothiazide 20-12.5 Mg Tabs (Lisinopril-hydrochlorothiazide) .Marland Kitchen... Take 1 tab by mouth once daily Her updated medication list for this problem includes:    Tribenzor 40-5-25 Mg Tabs (Olmesartan-amlodipine-hctz) .Marland Kitchen... Take 1 tablet by mouth once a day  BP today: 165/100 Prior BP: 161/109 (01/16/2010)  Prior 10 Yr Risk Heart Disease: Not enough information (01/16/2010)  Problem # 3:  B12 DEFICIENCY (ICD-266.2)  B12 injection given today. Will check B12 level with next set of labs in 2-3 mos.  Orders: Vit B12 1000 mcg (J3420) Admin of Therapeutic Inj  intramuscular or subcutaneous (08657)  Complete Medication List: 1)  Lamotrigine 100 Mg Tabs (Lamotrigine) .... Take 1 tab by mouth two times a day 2)  Ferralet 90 90-1 Mg Tabs (Fe cbn-fe gluc-fa-b12-c-dss) .... Take 1 tab by mouth once daily 3)  Lorazepam 1 Mg Tabs (Lorazepam) .... Take 1-2 tabs by mouth at bedtime 4)  Oxcarbazepine 300 Mg Tabs (Oxcarbazepine) .... Take 1 tab by mouth two times a day 5)  Abilify 10 Mg Tabs (Aripiprazole) .... Take 1 tab by mouth once daily 6)  Melatonin 5 Mg Tabs (Melatonin) .... Take 2 tabs by mouth at bedtime 7)  Potassium Chloride Cr 10 Meq Cr-caps (Potassium chloride) .... Take 1 cap by mouth once daily 8)  Tribenzor 40-5-25 Mg Tabs (Olmesartan-amlodipine-hctz) .... Take 1 tablet by mouth once a day 9)  Prilosec 20 Mg Cpdr (Omeprazole) .... Take one tablet by mouth twice a day 10)  Naprosyn 500 Mg Tabs (Naproxen) .Marland Kitchen.. 1 two times a day as needed for abd cramps- take w/ food.  Patient Instructions: 1)  Stay on Tribenzor -- RFd. 2)  Stay on Prilosec 2 x a day for 3 more wks then back down to once a day.   3)  Return for f/u BP with fasting labs in 2 mos. Prescriptions: OXCARBAZEPINE 300 MG TABS (OXCARBAZEPINE) Take 1 tab by mouth two times a day  #60 x 1   Entered and Authorized by:   Seymour Bars DO   Signed by:   Seymour Bars DO on  02/27/2010   Method used:   Electronically to        [facility]* (retail)       [address] [place], [place]  [address]       Ph: [phone]       Fax: [phone]   RxID:   [id number] TRIBENZOR 40-5-25 MG TABS (OLMESARTAN-AMLODIPINE-HCTZ) Take 1 tablet by mouth once a day  #30 x 3   Entered and Authorized by:   Seymour Bars DO   Signed by:   Seymour Bars DO on  02/27/2010   Method used:   Electronically to        [facility]* (retail)       [address] [place], [place]  [address]       Ph: [phone]       Fax: [phone]   RxID:   [id number]    Medication Administration  Injection # 1:    Medication: Vit B12 1000 mcg    Diagnosis: B12 DEFICIENCY (ICD-266.2)    Route: IM    Site: R deltoid    Exp Date: 12/20/2011    Lot #: [id number]    Mfr: [employer]    Patient tolerated injection without complications    Given by: Kathlene November (February 27, 2010 3:46 PM)  Orders Added: 1)  Est. Patient Level III [47425] 2)  Vit B12 1000 mcg [J3420] 3)  Admin of Therapeutic Inj  intramuscular or subcutaneous [95638]

## 2010-10-20 NOTE — Progress Notes (Signed)
Summary: Insurance denial o Victoza  Phone Note Outgoing Call   Call placed by: Kathlene November,  May 29, 2010 2:27 PM Call placed to: Insurer Summary of Call: Called to get prior auth on Victoza. Rep said potentially is going to be denied because she needs to have tried Byetta alone or in combo with a TZD or sulfanaluria?. He will send to pharmacist for review but states probably will be denied Initial call taken by: Kathlene November,  May 29, 2010 2:29 PM

## 2010-10-20 NOTE — Assessment & Plan Note (Signed)
Summary: CRAMPS AND HEAVY BLEEDING//VGJ   Vital Signs:  Patient profile:   45 year old female Height:      62.75 inches Weight:      217 pounds BMI:     38.89 O2 Sat:      98 % on Room air Pulse rate:   98 / minute BP sitting:   173 / 99  (left arm) Cuff size:   large  Vitals Entered By: Payton Spark CMA (March 04, 2010 3:21 PM)  O2 Flow:  Room air CC: Heavy menstrual bleeding and severe cramping x 2 days.    History of Present Illness: Caroline Arnold here today for heavy bleeding and severe cramps.  hx of PCOS.  'always had bad cramping'  last night cramping was 'pretty bad'.  took ibuprofen w/ good relief but as ibuprofen wore off it awoke pt from sleep, 'had me in tears'.  has been passing clots.  using overnight pads and changes them every 3-4 hrs.  would like periods to 'stop'.    HTN- stopped meds as directed last visit but has not started new medication.  denies CP, SOB, HAs, visual changes, edema.  Current Medications (verified): 1)  Lamotrigine 100 Mg Tabs (Lamotrigine) .... Take 1 Tab By Mouth Two Times A Day 2)  Ferralet 90 90-1 Mg Tabs (Fe Cbn-Fe Gluc-Fa-B12-C-Dss) .... Take 1 Tab By Mouth Once Daily 3)  Ranitidine Hcl 75 Mg Tabs (Ranitidine Hcl) .... Take As Needed 4)  Lorazepam 1 Mg Tabs (Lorazepam) .... Take 1-2 Tabs By Mouth At Bedtime 5)  Amlodipine Besylate 10 Mg Tabs (Amlodipine Besylate) .... Take 1 Tab By Mouth Once Daily 6)  Lisinopril-Hydrochlorothiazide 20-12.5 Mg Tabs (Lisinopril-Hydrochlorothiazide) .... Take 1 Tab By Mouth Once Daily 7)  Oxcarbazepine 300 Mg Tabs (Oxcarbazepine) .... Take 1 Tab By Mouth Two Times A Day 8)  Abilify 10 Mg Tabs (Aripiprazole) .... Take 1 Tab By Mouth Once Daily 9)  Melatonin 5 Mg Tabs (Melatonin) .... Take 2 Tabs By Mouth At Bedtime 10)  Potassium Chloride Cr 10 Meq Cr-Caps (Potassium Chloride) .... Take 1 Cap By Mouth Once Daily 11)  Tribenzor 40-5-25 Mg Tabs (Olmesartan-Amlodipine-Hctz) .... Take 1 Tablet By Mouth Once A  Day  Allergies (verified): No Known Drug Allergies  Past History:  Past Medical History: PCOS  Review of Systems      See HPI  Physical Exam  General:  Well-developed,well-nourished,in no acute distress; alert,appropriate and cooperative throughout examination Eyes:  pale conjunctiva Lungs:  Normal respiratory effort, chest expands symmetrically. Lungs are clear to auscultation, no crackles or wheezes. Heart:  Normal rate and regular rhythm. S1 and S2 normal without gallop, murmur, click, rub or other extra sounds.   Impression & Recommendations:  Problem # 1:  EXCESSIVE/ FREQUENT MENSTRUATION (ICD-626.2) Assessment New check CBC to determine extent of anemia.  refer to GYN to discuss possible ablation as pt desires to cease menstruation.  NSAIDs as directed.  reviewed supportive care and red flags that should prompt return.  Pt expresses understanding and is in agreement w/ this plan. Orders: Venipuncture ([id number]) TLB-CBC Platelet - w/Differential ([id number]) Gynecologic Referral (Gyn)  Problem # 2:  HYPERTENSION, BENIGN (ICD-401.1) Assessment: Unchanged pt stopped her previous meds as directed at appt last week but did not pick up new medication.  strongly encouraged her to start this ASAP. Her updated medication list for this problem includes:    Tribenzor 40-5-25 Mg Tabs (Olmesartan-amlodipine-hctz) .Marland Kitchen... Take 1 tablet by mouth once a day  Complete Medication List: 1)  Lamotrigine 100 Mg Tabs (Lamotrigine) .... Take 1 tab by mouth two times a day 2)  Ferralet 90 90-1 Mg Tabs (Fe cbn-fe gluc-fa-b12-c-dss) .... Take 1 tab by mouth once daily 3)  Lorazepam 1 Mg Tabs (Lorazepam) .... Take 1-2 tabs by mouth at bedtime 4)  Oxcarbazepine 300 Mg Tabs (Oxcarbazepine) .... Take 1 tab by mouth two times a day 5)  Abilify 10 Mg Tabs (Aripiprazole) .... Take 1 tab by mouth once daily 6)  Melatonin 5 Mg Tabs (Melatonin) .... Take 2 tabs by mouth at bedtime 7)  Potassium Chloride Cr  10 Meq Cr-caps (Potassium chloride) .... Take 1 cap by mouth once daily 8)  Tribenzor 40-5-25 Mg Tabs (Olmesartan-amlodipine-hctz) .... Take 1 tablet by mouth once a day 9)  Prilosec 20 Mg Cpdr (Omeprazole) .... Take one tablet by mouth twice a day 10)  Naprosyn 500 Mg Tabs (Naproxen) .Marland Kitchen.. 1 two times a day as needed for abd cramps- take w/ food.  Patient Instructions: 1)  Someone will call you with your GYN appt 2)  Take the Naprosyn as directed for pain- no additional ibuprofen, advil, motrin, aleve, etc.  you can add tylenol. 3)  We'll notify you of your lab results 4)  PLEASE START THE TRIBENZOR!!!  Your blood pressure is too high 5)  Hang in there! Prescriptions: NAPROSYN 500 MG TABS (NAPROXEN) 1 two times a day as needed for abd cramps- take w/ food.  #60 x 1   Entered and Authorized by:   Neena Rhymes MD   Signed by:   Neena Rhymes MD on 03/04/2010   Method used:   Electronically to        [facility]* (retail)       [address] [place], [place]  [address]       Ph: [phone]       Fax: [phone]   RxID:   [id number]

## 2010-10-20 NOTE — Letter (Signed)
Summary: Letter with Upper GI Results/[facility]  Letter with Upper GI Results/[facility]   Imported By: Lanelle Bal 02/19/2010 11:07:39  _____________________________________________________________________  External Attachment:    Type:   Image     Comment:   External Document

## 2010-10-20 NOTE — Assessment & Plan Note (Signed)
Summary: NOV: Cough, HTN, mood   Vital Signs:  Patient profile:   45 year old female Height:      62.75 inches Weight:      212 pounds BMI:     37.99 O2 Sat:      98 % on Room air Temp:     98.6 degrees F oral Pulse rate:   105 / minute BP sitting:   161 / 109  (left arm) Cuff size:   large  Vitals Entered By: Payton Spark CMA (January 16, 2010 3:29 PM)  O2 Flow:  Room air  Serial Vital Signs/Assessments:  Time      Position  BP       Pulse  Resp  Temp     By 4:05 PM             134/88                         Nani Gasser MD  CC: New to est. Allergy induced cough x 2 months. , Hypertension Management   CC:  New to est. Allergy induced cough x 2 months.  and Hypertension Management.  History of Present Illness: New to est. Allergy induced cough x 2 months.  Itchy cough in her thraot about 2 months ago. Non productive.  Feels like a feather in her throat.  Feels forced to cough like almost choking.  Can happen at anytime.  No other triggers.   Didn't try any allergy medication.  EArs itch all the time. No eye symptoms or ST.  No SB.  Occ bothers her at night when lies flat.  NO alleviating sxs. No fever.  Occ heartburn. Will use ranitidine. Has notice occ swallowing diffuculty since starting her psych medication.  Get hung in her throat.  Happens about once eveyr 2 weeks. will vomit the food back up and feel better.    Hypertension History:      She complains of visual symptoms.        Positive major cardiovascular risk factors include hyperlipidemia and hypertension.  Negative major cardiovascular risk factors include female age less than 48 years old and non-tobacco-user status.     Habits & Providers  Alcohol-Tobacco-Diet     Tobacco Status: never  Exercise-Depression-Behavior     Does Patient Exercise: no     Drug Use: no  Current Medications (verified): 1)  Lamotrigine 100 Mg Tabs (Lamotrigine) .... Take 1 Tab By Mouth Two Times A Day 2)  Ferralet 90 90-1 Mg  Tabs (Fe Cbn-Fe Gluc-Fa-B12-C-Dss) .... Take 1 Tab By Mouth Once Daily 3)  Ranitidine Hcl 75 Mg Tabs (Ranitidine Hcl) .... Take As Needed 4)  Lorazepam 1 Mg Tabs (Lorazepam) .... Take 1-2 Tabs By Mouth At Bedtime 5)  Amlodipine Besylate 10 Mg Tabs (Amlodipine Besylate) .... Take 1 Tab By Mouth Once Daily 6)  Lisinopril-Hydrochlorothiazide 20-12.5 Mg Tabs (Lisinopril-Hydrochlorothiazide) .... Take 1 Tab By Mouth Once Daily 7)  Oxcarbazepine 300 Mg Tabs (Oxcarbazepine) .... Take 1 Tab By Mouth Two Times A Day 8)  Abilify 10 Mg Tabs (Aripiprazole) .... Take 1 Tab By Mouth Once Daily 9)  Melatonin 5 Mg Tabs (Melatonin) .... Take 2 Tabs By Mouth At Bedtime 10)  Potassium Chloride Cr 10 Meq Cr-Caps (Potassium Chloride) .... Take 1 Cap By Mouth Once Daily  Past History:  Past Medical History: None  Past Surgical History: Laparotomy 1998  Family History: Parents with DM, HTN, chol Mother with stroke  Social History: 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   Never Smoked Alcohol use-no Drug use-no Regular exercise-no 1 caffeinate drink per day.  Smoking Status:  never Drug Use:  no Does Patient Exercise:  no  Review of Systems       No fever/sweats/weakness, unexplained weight loss/gain.  No vison changes.  No difficulty hearing/ringing in ears, hay fever/allergies.  No chest pain/discomfort, palpitations.  No Br lump/nipple discharge.  + cough/wheeze.  No blood in BM, nausea/vomiting/diarrhea.  No nighttime urination, leaking urine, unusual vaginal bleeding, discharge (penis or vagina).  No muscle/joint pain. No rash, change in mole.  No HA, memory loss.  No anxiety, sleep d/o, depression.  No easy bruising/bleeding, unexplained lump   Physical Exam  General:  Well-developed,well-nourished,in no acute distress; alert,appropriate and cooperative throughout examination Head:  Normocephalic and atraumatic without obvious  abnormalities. No apparent alopecia or balding. Eyes:  No corneal or conjunctival inflammation noted. EOMI. Perrla. Ears:  External ear exam shows no significant lesions or deformities.  Otoscopic examination reveals clear canals, tympanic membranes are intact bilaterally without bulging, retraction, inflammation or discharge. Hearing is grossly normal bilaterally. Nose:  External nasal examination shows no deformity or inflammation. Nasal mucosa are pink and moist without lesions or exudates. Turbinates are midly swollen and pale.  Mouth:  Oral mucosa and oropharynx without lesions or exudates.  Teeth in good repair. Neck:  No deformities, masses, or tenderness noted. Lungs:  Normal respiratory effort, chest expands symmetrically. Lungs are clear to auscultation, no crackles or wheezes. Heart:  Normal rate and regular rhythm. S1 and S2 normal without gallop, murmur, click, rub or other extra sounds. Abdomen:  Bowel sounds positive,abdomen soft and non-tender without masses, organomegaly or hernias noted. Skin:  no rashes.   Cervical Nodes:  No lymphadenopathy noted Psych:  Cognition and judgment appear intact. Alert and cooperative with normal attention span and concentration. No apparent delusions, illusions, hallucinations   Impression & Recommendations:  Problem # 1:  COUGH (ICD-786.2) Allergic rhinitis vs ACEi cough vs reflux.  Dsicussed possibiliteis. Her exam is normal.  For now trial of OTC oral antihistamin for one week. Call if not better in one week. Also for cost and convenience for her BP will change to Tribenzor. This will eliminate the possibliity of ACE cough.  If still not better then consider tx for GERD.    Problem # 2:  OTHER DYSPHAGIA (ICD-787.29) Her dysphagia is not very frequent but has been longstanding since 2002.  Since has slowly become more frequent I do recommend Gi referral for evaluation and possibly endoscopy. the may have a stricture or a Zen[place] or may be  medication or GERD related. She can certainly also try taking her ranitidine daily instead of as needed in the meantime.   Orders: Gastroenterology Referral (GI)  Problem # 3:  BIPOLAR DISORDER UNSPECIFIED (ICD-296.80)  Will refer to psych for med management and counseling.  [place]. If needs refills until first appt I told her we can certainly do that.   Orders: Physical Therapy Referral (PT)  Problem # 4:  HYPERTENSION, BENIGN (ICD-401.1) Repeat BP much better. Gave her samples of Tribenzor to try for 3 weeks in place of amlodipine and the lisinipril. If doing well can call and we can send new rx and use the $25 copay card.  This will also eliminate the posssibliy of ACEi cough.  Her  updated medication list for this problem includes:    Amlodipine Besylate 10 Mg Tabs (Amlodipine besylate) .Marland Kitchen... Take 1 tab by mouth once daily    Lisinopril-hydrochlorothiazide 20-12.5 Mg Tabs (Lisinopril-hydrochlorothiazide) .Marland Kitchen... Take 1 tab by mouth once daily    Tribenzor 40-5-25 Mg Tabs (Olmesartan-amlodipine-hctz) .Marland Kitchen... Take 1 tablet by mouth once a day  Complete Medication List: 1)  Lamotrigine 100 Mg Tabs (Lamotrigine) .... Take 1 tab by mouth two times a day 2)  Ferralet 90 90-1 Mg Tabs (Fe cbn-fe gluc-fa-b12-c-dss) .... Take 1 tab by mouth once daily 3)  Ranitidine Hcl 75 Mg Tabs (Ranitidine hcl) .... Take as needed 4)  Lorazepam 1 Mg Tabs (Lorazepam) .... Take 1-2 tabs by mouth at bedtime 5)  Amlodipine Besylate 10 Mg Tabs (Amlodipine besylate) .... Take 1 tab by mouth once daily 6)  Lisinopril-hydrochlorothiazide 20-12.5 Mg Tabs (Lisinopril-hydrochlorothiazide) .... Take 1 tab by mouth once daily 7)  Oxcarbazepine 300 Mg Tabs (Oxcarbazepine) .... Take 1 tab by mouth two times a day 8)  Abilify 10 Mg Tabs (Aripiprazole) .... Take 1 tab by mouth once daily 9)  Melatonin 5 Mg Tabs (Melatonin) .... Take 2 tabs by mouth at bedtime 10)  Potassium Chloride Cr 10 Meq  Cr-caps (Potassium chloride) .... Take 1 cap by mouth once daily 11)  Tribenzor 40-5-25 Mg Tabs (Olmesartan-amlodipine-hctz) .... Take 1 tablet by mouth once a day  Hypertension Assessment/Plan:      The patient's hypertensive risk group is category B: At least one risk factor (excluding diabetes) with no target organ damage.  Today's blood pressure is 161/109.    Patient Instructions: 1)  Trial of Tribenzor in place of the lisinopril/HCTZ and the amlodipine.  2)  We will refer you to GI for the swallowing difficulty 3)  REcomend start claritin or zyrtec once a day for on week and see if feels better.  Prescriptions: TRIBENZOR 40-5-25 MG TABS (OLMESARTAN-AMLODIPINE-HCTZ) Take 1 tablet by mouth once a day  #15 x 0   Entered and Authorized by:   Nani Gasser MD   Signed by:   Nani Gasser MD on 01/16/2010   Method used:   Samples Given   RxID:   [id number]   Mammogram Result Date:  11/12/2007 Mammogram Result:  normal

## 2010-10-20 NOTE — Procedures (Signed)
Summary: Upper GI Endoscopy/[facility]  Upper GI Endoscopy/[facility]   Imported By: Lanelle Bal 02/23/2010 09:41:38  _____________________________________________________________________  External Attachment:    Type:   Image     Comment:   External Document

## 2010-10-20 NOTE — Progress Notes (Signed)
Summary: results  Phone Note Call from Patient Call back at Home Phone [phone]   Caller: Patient Call For: Vienne Corcoran Summary of Call: called wanting lab results Initial call taken by: Kathlene November,  March 05, 2010 2:07 PM  Follow-up for Phone Call        pt is mildly anemic.  should continue her multivitamin plus iron.  no need for transfusion at this time.  blood count will likely improve as she finishes her period for the month. Follow-up by: Neena Rhymes MD,  March 05, 2010 2:19 PM  Additional Follow-up for Phone Call Additional follow up Details #1::        Pt notified of MD instructions and of lab results. Additional Follow-up by: Kathlene November,  March 05, 2010 2:24 PM

## 2010-10-20 NOTE — Assessment & Plan Note (Signed)
Summary: hives   Vital Signs:  Patient profile:   45 year old female Height:      62.75 inches Weight:      198 pounds Pulse rate:   65 / minute BP sitting:   128 / 83  Vitals Entered By: Avon Gully CMA, Duncan Dull) (June 09, 2010 1:44 PM) CC: hives   Primary Care Provider:  Nani Gasser MD  CC:  hives.  History of Present Illness: Has been taking zyrtec and still having hives. No change in skin changes in the mouth, nose or genital area.  It is very itchy.  Does have pet but has ha animals for 12 years. No chagne in soaps, detergents, lotions, etc.  NOt the vitoza, zoloft or the [place].  Ran out of metoprolol and lamictal for 4 days and rash didn't improve, actually was worsening during thtat time. Still using zyrtec daily wiht some relief. Still no respirotory sxs.   Current Medications (verified): 1)  Lamotrigine 100 Mg Tabs (Lamotrigine) .... Take 1 Tab By Mouth Two Times A Day 2)  Lorazepam 1 Mg Tabs (Lorazepam) .... Take 1-2 Tabs By Mouth At Bedtime 3)  Oxcarbazepine 300 Mg Tabs (Oxcarbazepine) .... Take 1 Tab By Mouth One  Times A Day 4)  Melatonin 5 Mg Tabs (Melatonin) .... Take 2 Tabs By Mouth At Bedtime 5)  Norvasc 10 Mg Tabs (Amlodipine Besylate) .Marland Kitchen.. 1 Tab By Mouth Once A Day 6)  Prilosec 20 Mg Cpdr (Omeprazole) .... Take One Tablet By Mouth Twice A Day 7)  Maxzide-25 37.5-25 Mg Tabs (Triamterene-Hctz) .Marland Kitchen.. 1 Tab By Mouth Daily 8)  Metoprolol Succinate 50 Mg Xr24h-Tab (Metoprolol Succinate) .Marland Kitchen.. 1 Tab By Mouth Daily 9)  Victoza 18 Mg/61ml Soln (Liraglutide) .... 0.6 Mg Subcutaneously Daily X 1 Week, Then Incerase To 1.2 Mg Subcutaneously Daily 10)  Zoloft 25 Mg Tabs (Sertraline Hcl) .... One Tablet By Mouth Once A Day  Allergies (verified): 1)  ! Metformin Hcl (Metformin Hcl)  Comments:  Nurse/Medical Assistant: The patient's medications and allergies were reviewed with the patient and were updated in the Medication and Allergy Lists. Avon Gully  CMA, Duncan Dull) (June 09, 2010 1:45 PM)  Physical Exam  General:  Well-developed,well-nourished,in no acute distress; alert,appropriate and cooperative throughout examination Lungs:  Normal respiratory effort, chest expands symmetrically. Lungs are clear to auscultation, no crackles or wheezes. Heart:  Normal rate and regular rhythm. S1 and S2 normal without gallop, murmur, click, rub or other extra sounds. Skin:  Pink erythematous papules with irregular fading border.  A few appear to have a centrally clear area. Located on her chest, abomen, breast, back today.  Psych:  Cognition and judgment appear intact. Alert and cooperative with normal attention span and concentration. No apparent delusions, illusions, hallucinations   Impression & Recommendations:  Problem # 1:  CUTANEOUS ERUPTIONS, DRUG-INDUCED (ICD-693.0)  I still think this could be a drug reaction. I am not concerned about Stevens-Johnson at this tim basedo nteh apperance of her rash,  but I am most suspicious of her medications. It is clearly not the Januvia, victoza, zoloft or metoprolol.  Wil refer to Allergy office and in the meantime can continue zyrtec. Will hold off on steroids since I want the allergist to see her rash.    Orders: Allergy Referral  (Allergy)  Complete Medication List: 1)  Lamotrigine 100 Mg Tabs (Lamotrigine) .... Take 1 tab by mouth two times a day 2)  Lorazepam 1 Mg Tabs (Lorazepam) .... Take 1-2 tabs by mouth  at bedtime 3)  Oxcarbazepine 300 Mg Tabs (Oxcarbazepine) .... Take 1 tab by mouth one  times a day 4)  Melatonin 5 Mg Tabs (Melatonin) .... Take 2 tabs by mouth at bedtime 5)  Norvasc 10 Mg Tabs (Amlodipine besylate) .Marland Kitchen.. 1 tab by mouth once a day 6)  Prilosec 20 Mg Cpdr (Omeprazole) .... Take one tablet by mouth twice a day 7)  Maxzide-25 37.5-25 Mg Tabs (Triamterene-hctz) .Marland Kitchen.. 1 tab by mouth daily 8)  Metoprolol Succinate 50 Mg Xr24h-tab (Metoprolol succinate) .Marland Kitchen.. 1 tab by mouth daily 9)   Victoza 18 Mg/65ml Soln (Liraglutide) .... 0.6 mg subcutaneously daily x 1 week, then incerase to 1.2 mg subcutaneously daily 10)  Zoloft 25 Mg Tabs (Sertraline hcl) .... One tablet by mouth once a day  Patient Instructions: 1)  We will call you with the allergy referral.

## 2010-10-20 NOTE — Assessment & Plan Note (Signed)
Summary: BP check   Vital Signs:  Patient profile:   45 year old female Pulse rate:   96 / minute Pulse rhythm:   regular Resp:     14 per minute BP sitting:   164 / 104  (right arm) Cuff size:   large  Vitals Entered By: Mervin Kung CMA Duncan Dull) (March 25, 2010 10:45 AM) CC: Room 5  Blood Pressure check. Pt had procedure this am. BP still elevated. States she is taking all meds and not missing doses.  Needs BP controlled prior to ablation in a couple of weeks.   Allergies (verified): No Known Drug Allergies   Impression & Recommendations:  Problem # 1:  HYPERTENSION, BENIGN (ICD-401.1) BP high today.  Will add Metoprolol 50 mg once a day to help get her BP down.  Continue all other meds.  F/U with Dr Linford Arnold in 1 month. Her updated medication list for this problem includes:    Norvasc 10 Mg Tabs (Amlodipine besylate) .Marland Kitchen... 1 tab by mouth once a day    Maxzide-25 37.5-25 Mg Tabs (Triamterene-hctz) .Marland Kitchen... 1 tab by mouth daily    Metoprolol Succinate 50 Mg Xr24h-tab (Metoprolol succinate) .Marland Kitchen... 1 tab by mouth daily  BP today: 164/104 Prior BP: 177/115 (03/11/2010)  Prior 10 Yr Risk Heart Disease: Not enough information (01/16/2010)  Complete Medication List: 1)  Lamotrigine 100 Mg Tabs (Lamotrigine) .... Take 1 tab by mouth two times a day 2)  Ferralet 90 90-1 Mg Tabs (Fe cbn-fe gluc-fa-b12-c-dss) .... Take 1 tab by mouth once daily 3)  Lorazepam 1 Mg Tabs (Lorazepam) .... Take 1-2 tabs by mouth at bedtime 4)  Oxcarbazepine 300 Mg Tabs (Oxcarbazepine) .... Take 1 tab by mouth two times a day 5)  Abilify 10 Mg Tabs (Aripiprazole) .... Take 1 tab by mouth once daily 6)  Melatonin 5 Mg Tabs (Melatonin) .... Take 2 tabs by mouth at bedtime 7)  Norvasc 10 Mg Tabs (Amlodipine besylate) .Marland Kitchen.. 1 tab by mouth once a day 8)  Prilosec 20 Mg Cpdr (Omeprazole) .... Take one tablet by mouth twice a day 9)  Naprosyn 500 Mg Tabs (Naproxen) .Marland Kitchen.. 1 two times a day as needed for abd  cramps- take w/ food. 10)  Maxzide-25 37.5-25 Mg Tabs (Triamterene-hctz) .Marland Kitchen.. 1 tab by mouth daily 11)  Metoprolol Succinate 50 Mg Xr24h-tab (Metoprolol succinate) .Marland Kitchen.. 1 tab by mouth daily Prescriptions: METOPROLOL SUCCINATE 50 MG XR24H-TAB (METOPROLOL SUCCINATE) 1 tab by mouth daily  #30 x 1   Entered and Authorized by:   Seymour Bars DO   Signed by:   Seymour Bars DO on 03/25/2010   Method used:   Electronically to        [facility]* (retail)       [address] [place], [place]  [address]       Ph: [phone]       Fax: [phone]   RxID:   [id number]   Current Allergies (reviewed today): No known allergies   Appended Document: BP check Pls let pt know that I am adding Metoprolol 50 mg once  a day to her current regimen for high BP.  She is to f/u with Dr Linford Arnold in 1 month.  Seymour Bars, D.O.  Appended Document: BP check Attempted to reach pt at home and phone is turned off, unable to leave message.    Appended Document: BP check Pt notified of new med. Pt states she will  need appt for surgical clearance in 2 weeks. Scheduled appt. with Dr. Donnal Moat for 04/09/10 @ 11am.

## 2010-10-20 NOTE — Progress Notes (Signed)
Summary: meds  Phone Note Call from Patient   Caller: Patient Call For: Nani Gasser MD Summary of Call: pt states she cannot take the metformin. She is only taking it once a day instead of twice a day and it is still making her nauseous and she has diarrhea and she feels awful.Please advise Initial call taken by: Avon Gully CMA, Duncan Dull),  May 12, 2010 1:08 PM  Follow-up for Phone Call        OK will change med. New one sent to pharm.  Follow-up by: Nani Gasser MD,  May 12, 2010 1:19 PM  Additional Follow-up for Phone Call Additional follow up Details #1::        Pt notified of MD instructions and that new med sent to pharmacy. Additional Follow-up by: Kathlene November,  May 12, 2010 1:55 PM    New/Updated Medications: ONGLYZA 2.5 MG TABS (SAXAGLIPTIN HCL) Take 1 tablet by mouth once a day Prescriptions: ONGLYZA 2.5 MG TABS (SAXAGLIPTIN HCL) Take 1 tablet by mouth once a day  #30 x 1   Entered and Authorized by:   Nani Gasser MD   Signed by:   Nani Gasser MD on 05/12/2010   Method used:   Electronically to        [facility]* (retail)       [address] [place], [place]  [address]       Ph: [phone]       Fax: [phone]   RxID:   [id number]

## 2010-10-20 NOTE — Progress Notes (Signed)
Summary: BP  Phone Note Call from Patient   Caller: Patient Call For: Jarica Plass Summary of Call: Seen at [facility] in building today and BP 188/118. Please advise Initial call taken by: Kathlene November,  March 11, 2010 1:03 PM  Follow-up for Phone Call        she came in for nurse visit. Follow-up by: Seymour Bars DO,  March 11, 2010 1:08 PM

## 2010-10-20 NOTE — Progress Notes (Signed)
Summary: new rx  Phone Note From Pharmacy   [facility]er[facility] Call For: [facility]  Summary of Call: requesting new  rx for  amlodipine 10 mg tablet 1  by mouth once daily and triamteren37.5mg  /HCTZ 25 mg 1 po once daily  Initial call taken by: Avon Gully CMA, Duncan Dull),  June 23, 2010 12:51 PM  Follow-up for Phone Call        [facility] to send. She was seen in August.  Follow-up by: Nani Gasser MD,  June 23, 2010 12:59 PM    Prescriptions: Joseph Pierini 37.5-25 MG TABS (TRIAMTERENE-HCTZ) 1 tab by mouth daily  #30 x 2   Entered by:   Kathlene November LPN   Authorized by:   Nani Gasser MD   Signed by:   Kathlene November LPN on 11/91/4782   Method used:   Electronically to        [facility]* (retail)       [address] [place], [place]  [address]       Ph: [phone]       Fax: [phone]   RxID:   [id number] NORVASC 10 MG TABS (AMLODIPINE BESYLATE) 1 tab by mouth once a day  #30 x 2   Entered by:   Kathlene November LPN   Authorized by:   Nani Gasser MD   Signed by:   Kathlene November LPN on 47/42/5956   Method used:   Electronically to        [facility]* (retail)       [address] [place], [place]  [address]       Ph: [phone]       Fax: [phone]   RxID:   [id number]

## 2010-10-20 NOTE — Progress Notes (Signed)
Summary: Med not covered by insurance  Phone Note From Pharmacy   Caller: [facility] [address] Call For: [employer] of Call: Insurance is not [place] cover this med. Says has to do step therapy first. Has to try Januvia, Janumet. Please advise Initial call taken by: Kathlene November,  May 13, 2010 8:20 AM  Follow-up for Phone Call        [facility], will change to [place].  Follow-up by: Nani Gasser MD,  May 13, 2010 9:14 AM  Additional Follow-up for Phone Call Additional follow up Details #1::        spoke with pt and pt is ok to chnage to [place] Additional Follow-up by: Avon Gully CMA, Duncan Dull),  May 13, 2010 10:23 AM    New/Updated Medications: JANUVIA 100 MG TABS (SITAGLIPTIN PHOSPHATE) Take 1 tablet by mouth once a day Prescriptions: JANUVIA 100 MG TABS (SITAGLIPTIN PHOSPHATE) Take 1 tablet by mouth once a day  #30 x 1   Entered and Authorized by:   Nani Gasser MD   Signed by:   Nani Gasser MD on 05/13/2010   Method used:   Electronically to        [facility]* (retail)       [address] [place], [place]  [address]       Ph: [phone]       Fax: [phone]   RxID:   [id number]

## 2010-10-21 ENCOUNTER — Encounter: Payer: Self-pay | Admitting: Family Medicine

## 2010-10-22 NOTE — Progress Notes (Signed)
Summary: Referral for MRI  Phone Note Call from Patient Call back at Home Phone [phone]   Summary of Call: pt is still having problems with shoulder pain and wuold like referral for MRI Initial call taken by: Francee Piccolo CMA Duncan Dull),  October 13, 2010 11:44 AM  Follow-up for Phone Call        OK, it her right still the worst. Since that is the side we xrayed I would recommend the right shouldre but please ask her.  Follow-up by: Nani Gasser MD,  October 13, 2010 2:51 PM  Additional Follow-up for Phone Call Additional follow up Details #1::        Right shoulder bothers her the most.  Pt has pain from right ear down her arm to her hand.  Pt will have pain in left shoulder after overcompensating for restricted use of right shoulder, but otherwise left shoulder does not bother her. Additional Follow-up by: Francee Piccolo CMA Duncan Dull),  October 13, 2010 4:45 PM    Additional Follow-up for Phone Call Additional follow up Details #2::    Ok, order entered. Sue Lush can you go under orders and print it off.  Also make sure it is for the right shoulder.  Follow-up by: Nani Gasser MD,  October 14, 2010 11:38 AM  Additional Follow-up for Phone Call Additional follow up Details #3:: Details for Additional Follow-up Action Taken: scheduled for this sat Additional Follow-up by: Avon Gully CMA, Duncan Dull),  October 16, 2010 10:59 AM

## 2010-10-22 NOTE — Assessment & Plan Note (Signed)
Summary: shoulder pain, hand numbness   Vital Signs:  Patient profile:   45 year old female Height:      62.75 inches Weight:      197 pounds Pulse rate:   92 / minute BP sitting:   138 / 87  (right arm) Cuff size:   regular  Vitals Entered By: Avon Gully CMA, Duncan Dull) (September 25, 2010 2:02 PM) CC: shoulder pain and stiffness   Primary Care Provider:  Nani Gasser MD  CC:  shoulder pain and stiffness.  History of Present Illness: shoulder pain and stiffness.  Pain for several week in both shoulders. The right is worse.  She is right handed.  Thought initally it was overuse.  Resting does seem to help. OTC NSAIDs do help.  Started noticing waking up wtih pain in the AM. She is a side sleeper and normall tucks her arm under pillow but tried not to do this and still painful. Now periods of rest down help. Pain starts at about 80 degree and then can't get it to above about 120 degrees at all.  Can do it reaches out from the side.  Can still do her ADLs. Occ losses feeling in her hands. Often happes when does alot with her hands.  Doesn't type all day long. ]Hands never fall asleep at night.  Hx of DM. she says typically she shakes her hands out for a minute or two they will go back to normal.  No other alleviating symptoms.  Current Medications (verified): 1)  Lamotrigine 100 Mg Tabs (Lamotrigine) .... Take 1 Tab By Mouth Two Times A Day 2)  Lorazepam 1 Mg Tabs (Lorazepam) .... Take 1-2 Tabs By Mouth At Bedtime 3)  Oxcarbazepine 300 Mg Tabs (Oxcarbazepine) .... Take 1 Tab By Mouth One  Times A Day 4)  Melatonin 5 Mg Tabs (Melatonin) .... Take 2 Tabs By Mouth At Bedtime 5)  Norvasc 10 Mg Tabs (Amlodipine Besylate) .Marland Kitchen.. 1 Tab By Mouth Once A Day 6)  Prilosec 20 Mg Cpdr (Omeprazole) .... Take One Tablet By Mouth Twice A Day 7)  Maxzide-25 37.5-25 Mg Tabs (Triamterene-Hctz) .Marland Kitchen.. 1 Tab By Mouth Daily 8)  Metoprolol Succinate 50 Mg Xr24h-Tab (Metoprolol Succinate) .Marland Kitchen.. 1 Tab By Mouth  Daily 9)  Januvia 100 Mg Tabs (Sitagliptin Phosphate) .... Take 1 Tablet By Mouth Once A Day 10)  Zoloft 25 Mg Tabs (Sertraline Hcl) .... One Tablet By Mouth Once A Day  Allergies (verified): 1)  ! Metformin Hcl (Metformin Hcl)  Comments:  Nurse/Medical Assistant: The patient's medications and allergies were reviewed with the patient and were updated in the Medication and Allergy Lists. Avon Gully CMA, Duncan Dull) (September 25, 2010 2:02 PM)  Past History:  Past Surgical History: Last updated: 01/16/2010 Laparotomy 1998  Social History: Last updated: 01/16/2010 [employer] for [employer].  HS degree and some college.  Married to [employer] with no kids.   Never Smoked Alcohol use-no Drug use-no Regular exercise-no 1 caffeinate drink per day.   Physical Exam  General:  Well-developed,well-nourished,in no acute distress; alert,appropriate and cooperative throughout examination Msk:  she had she has normal range of motion of both shoulders.  She clearly has discomfort in the right shoulder when she gets about 90 degrees abduction.  She does have decreasing or internal rotation with both shoulders bilaterally.  Her strength in her shoulders wrists and elbows is 5/5.  She is a positive empty can test on the right.  Her neck has normal  range of motion.  She does feel some pulling and tightness between the shoulder blades when she flexes her neck forward.  She is nontender over the spine that is tender over the muscles between the spine and the scapula bilaterally.  She is slightly tender of the right AC joint.  Her left shoulder is nontender.  She has a negative Tinel's and Phalen sign both wrists.  Hand and finger strength is 5/5, wrist range of motion is normal.  The   Impression & Recommendations:  Problem # 1:  SHOULDER PAIN (ICD-719.41) Assessment New we discussed that she could have bilateral bursitis of the shoulder.  She admits that her pain is especially worse when she  sleeps on her shoulders and she is a side sleep her.  She did have a positive anti-can test on the right shoulder and this could have some rotator cuff syndrome as well.  I did give her handout on stretches to do for both shoulders for rotator cuff.  I also recommend anti-inflammatory and icing the area as needed.  We discussed that she is not improving over the next few weeks and we can consider formal physical therapy for further evaluation by orthopedics.  Because her right shoulder is the worst and has been hurting for over a month I did recommend for going ahead and getting an x-ray today. Orders: T-DG Shoulder*R* [id number])  Problem # 2:  NUMBNESS, HAND (ICD-782.0) Assessment: New her exam is not consistent with carpal tunnel and being that both hands often going on at the same time I suspect that her problem is in her neck.  I would like to get a cervical spine film today.  We also explained to her that physical therapy could help if she does have a herniation with cervical disks.  I did give her handout on neck stretches to start to try to rehab the area somewhat. Orders: T-DG Cervical Spine 2-3 Views [id number])  Complete Medication List: 1)  Lamotrigine 100 Mg Tabs (Lamotrigine) .... Take 1 tab by mouth two times a day 2)  Lorazepam 1 Mg Tabs (Lorazepam) .... Take 1-2 tabs by mouth at bedtime 3)  Oxcarbazepine 300 Mg Tabs (Oxcarbazepine) .... Take 1 tab by mouth one  times a day 4)  Melatonin 5 Mg Tabs (Melatonin) .... Take 2 tabs by mouth at bedtime 5)  Norvasc 10 Mg Tabs (Amlodipine besylate) .Marland Kitchen.. 1 tab by mouth once a day 6)  Prilosec 20 Mg Cpdr (Omeprazole) .... Take one tablet by mouth twice a day 7)  Maxzide-25 37.5-25 Mg Tabs (Triamterene-hctz) .Marland Kitchen.. 1 tab by mouth daily 8)  Metoprolol Succinate 50 Mg Xr24h-tab (Metoprolol succinate) .Marland Kitchen.. 1 tab by mouth daily 9)  Januvia 100 Mg Tabs (Sitagliptin phosphate) .... Take 1 tablet by mouth once a day 10)  Zoloft 25 Mg Tabs (Sertraline hcl)  .... One tablet by mouth once a day  Patient Instructions: 1)  Can start the stretches for your neck and your shoulder.  2)  We will call you with the xray results next week 3)  Continue the NSAID 4)  Also can use your rice heat packs for comfort.    Orders Added: 1)  T-DG Cervical Spine 2-3 Views [72040] 2)  T-DG Shoulder*R* [73030] 3)  Est. Patient Level IV [82956]

## 2010-10-22 NOTE — Progress Notes (Signed)
Summary: meds--cyclobenzaprine  Phone Note Call from Patient   Caller: Patient Call For: Nani Gasser MD Summary of Call: Pt called and states she now has pain in her wrist and elbow and cramping in her shoulder and decided that she does want the muscle relaxers Initial call taken by: Avon Gully CMA, Duncan Dull),  September 28, 2010 12:50 PM  Follow-up for Phone Call        Rx Called In Follow-up by: Nani Gasser MD,  September 28, 2010 1:01 PM  Additional Follow-up for Phone Call Additional follow up Details #1::        Pt notified. Wanted Rx sent to a different location. Advised pt she could call [facility] in [place] and have them transfer the script to the location of her choice. Nicki Guadalajara Fergerson CMA (AAMA)  September 29, 2010 1:50 PM     New/Updated Medications: CYCLOBENZAPRINE HCL 10 MG TABS (CYCLOBENZAPRINE HCL) Take 1 tablet by mouth once a day at bedtime as needed muscle spasm Prescriptions: CYCLOBENZAPRINE HCL 10 MG TABS (CYCLOBENZAPRINE HCL) Take 1 tablet by mouth once a day at bedtime as needed muscle spasm  #30 x 0   Entered and Authorized by:   Nani Gasser MD   Signed by:   Nani Gasser MD on 09/28/2010   Method used:   Electronically to        [facility]* (retail)       [address] [place], [place]  [address]       Ph: [phone]       Fax: [phone]   RxID:   [id number]

## 2010-11-17 NOTE — Letter (Signed)
Summary: [facility]  [facility]   Imported By: Maryln Gottron 11/09/2010 13:57:28  _____________________________________________________________________  External Attachment:    Type:   Image     Comment:   External Document

## 2010-12-04 LAB — CBC
HCT: 37.9 % (ref 36.0–46.0)
Hemoglobin: 12.4 g/dL (ref 12.0–15.0)
MCH: 25.9 pg — ABNORMAL LOW (ref 26.0–34.0)
MCHC: 32.8 g/dL (ref 30.0–36.0)
MCV: 79 fL (ref 78.0–100.0)
RBC: 4.8 MIL/uL (ref 3.87–5.11)
WBC: 13.1 10*3/uL — ABNORMAL HIGH (ref 4.0–10.5)

## 2010-12-04 LAB — COMPREHENSIVE METABOLIC PANEL
ALT: 46 U/L — ABNORMAL HIGH (ref 0–35)
BUN: 6 mg/dL (ref 6–23)
Sodium: 133 mEq/L — ABNORMAL LOW (ref 135–145)
Total Bilirubin: 0.7 mg/dL (ref 0.3–1.2)
Total Protein: 7.4 g/dL (ref 6.0–8.3)

## 2010-12-04 LAB — PREGNANCY, URINE: Preg Test, Ur: NEGATIVE

## 2010-12-04 LAB — GLUCOSE, CAPILLARY

## 2010-12-09 ENCOUNTER — Other Ambulatory Visit: Payer: Self-pay | Admitting: Family Medicine

## 2011-01-01 ENCOUNTER — Encounter (HOSPITAL_COMMUNITY): Payer: Self-pay | Admitting: Psychiatry

## 2011-01-08 ENCOUNTER — Other Ambulatory Visit: Payer: Self-pay | Admitting: Family Medicine

## 2011-01-19 ENCOUNTER — Encounter (HOSPITAL_COMMUNITY): Payer: Self-pay | Admitting: Psychiatry

## 2011-02-02 NOTE — Assessment & Plan Note (Signed)
NAME:  Caroline Arnold, Caroline Arnold               ACCOUNT NO.:  [id number]   MEDICAL RECORD NO.:  [id number]          PATIENT TYPE:  POB   LOCATION:  [facility] at [place]         FACILITY:  [facility]   PHYSICIAN:  Elsie Lincoln, MD      DATE OF BIRTH:  [birth year]   DATE OF SERVICE:  03/25/2010                                  CLINIC NOTE   The patient is a 45 year old female with menorrhagia, returns for  endometrial biopsy.  The patient's last hemoglobin on 06/16 showed a  hemoglobin of 10.8.  She is anemic, platelets 323.  Ultrasound of the  uterus showed uterus is 8.1 cm x 4 x 4.  Right ovary and left ovary are  normal.  No pelvic fluid or separate adnexal masses.  The patient has  taken a sedative prior to the endometrial biopsy.  EPT is negative.  After informed consent was obtained, the patient was placed in the  dorsal lithotomy position.  Bivalve speculum was placed into the  patient's vagina and the cervix was clean with Betadine.  The anterior  lip of the cervix was grasped with single-tooth tenaculum.  One pass was  made with the endometrial Pipelle.  The uterus sounded to approximately  8.5 cm.  Adequate tissue seem to be obtained.  The patient was extremely  uncomfortable in this portion of exam.  I do not believe the patient  would ever be able to tolerate an IUD insertion in the office.   ASSESSMENT AND PLAN:  A 45 year old female with dysmenorrhea and  menorrhagia.  1. The patient's blood pressures is still 160/100.  She is on 2 new      medications for blood pressure.  The patient is to go back to      primary care physician for medical clearance, also get blood      pressure under control.  2. Endometrial biopsy done today.  We will get results back and come      up with a plan.  The patient most likely going to get an ablation.  3. Awaiting operative reports from prior doctor.  4. Continue iron.           ______________________________  Elsie Lincoln, MD     KL/MEDQ  D:   03/25/2010  T:  03/26/2010  Job:  [id number]
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
## 2011-02-02 NOTE — Assessment & Plan Note (Signed)
NAME:  Caroline Arnold, Caroline Arnold               ACCOUNT NO.:  [id number]   MEDICAL RECORD NO.:  [id number]          PATIENT TYPE:  POB   LOCATION:  [facility] at [place]         FACILITY:  [facility]   PHYSICIAN:  Elsie Lincoln, MD      DATE OF BIRTH:  1966-05-24   DATE OF SERVICE:  04/15/2010                                  CLINIC NOTE   The patient is a 45 year old para 0 female who presents for a Pap smear  and also to discuss endometrial biopsy results.  Today, the patient  seems very sad and this is a depressive day from her many depressive  disease and she see a psychiatrist on August 1.  We are still awaiting  for medical clearance but fortunately today her blood pressure is  147/90.  Her doctor is doing some blood work and if this is clear, the  patient states that she will get clearance for her general anesthesia.  Her pathology showed benign secretory endometrium with no hyperplasia or  malignancy.  We did a Pap smear today because she cannot remember her  last Pap smear.  We discussed endometrial ablation as the patient is not  a candidate for estrogen-containing contraception given her age and hard  to control hypertension.  The patient does not want to try Depo-Provera  IUD.  We discussed the risks of endometrial ablation including bleeding,  infection, and damage to the back of the uterus and a vaginal burn.  We  discussed they were using the hydrothermal ablation and she understands  what this is.  I told her that she would have a 50% chance of amenorrhea  and 80% chance of reduction in bleeding that would improve her lifestyle  and anemia.  I also reviewed her records from her reproductive  endocrinologist and it did show that she had a laparotomy with lysis of  adhesions and it did show that these adhesions were extensive.  Again,  the patient needed a Pap smear today which was completed without  incident.  She did have an abnormal Pap smear, we did a colpo at the  time of the surgery  because the patient does not tolerate pelvic exams  well.  I will await for the medical clearance from her [facility] for proceeding to the operating room.           ______________________________  Elsie Lincoln, MD     KL/MEDQ  D:  04/15/2010  T:  04/16/2010  Job:  [id number]

## 2011-02-07 ENCOUNTER — Other Ambulatory Visit: Payer: Self-pay | Admitting: Family Medicine

## 2011-02-12 ENCOUNTER — Encounter (INDEPENDENT_AMBULATORY_CARE_PROVIDER_SITE_OTHER): Payer: Private Health Insurance - Indemnity | Admitting: Psychiatry

## 2011-02-12 ENCOUNTER — Other Ambulatory Visit: Payer: Self-pay | Admitting: *Deleted

## 2011-02-12 DIAGNOSIS — F339 Major depressive disorder, recurrent, unspecified: Secondary | ICD-10-CM

## 2011-02-12 DIAGNOSIS — F411 Generalized anxiety disorder: Secondary | ICD-10-CM

## 2011-02-12 MED ORDER — SITAGLIPTIN PHOSPHATE 100 MG PO TABS: 100.0000 mg | ORAL_TABLET | Freq: Every day | ORAL | Status: DC

## 2011-03-09 ENCOUNTER — Other Ambulatory Visit: Payer: Self-pay | Admitting: Family Medicine

## 2011-04-13 ENCOUNTER — Other Ambulatory Visit: Payer: Self-pay | Admitting: Family Medicine

## 2011-04-15 IMAGING — CR DG CERVICAL SPINE 2 OR 3 VIEWS
3 series · 3 of 3 positions shown · non-contrast
Comparison: None.

CLINICAL DATA: 44-year-old female with hand numbness, right
shoulder pain, weakness.  No known injury.

CERVICAL SPINE - 2-3 VIEW

[view not recorded (1 of 3)]
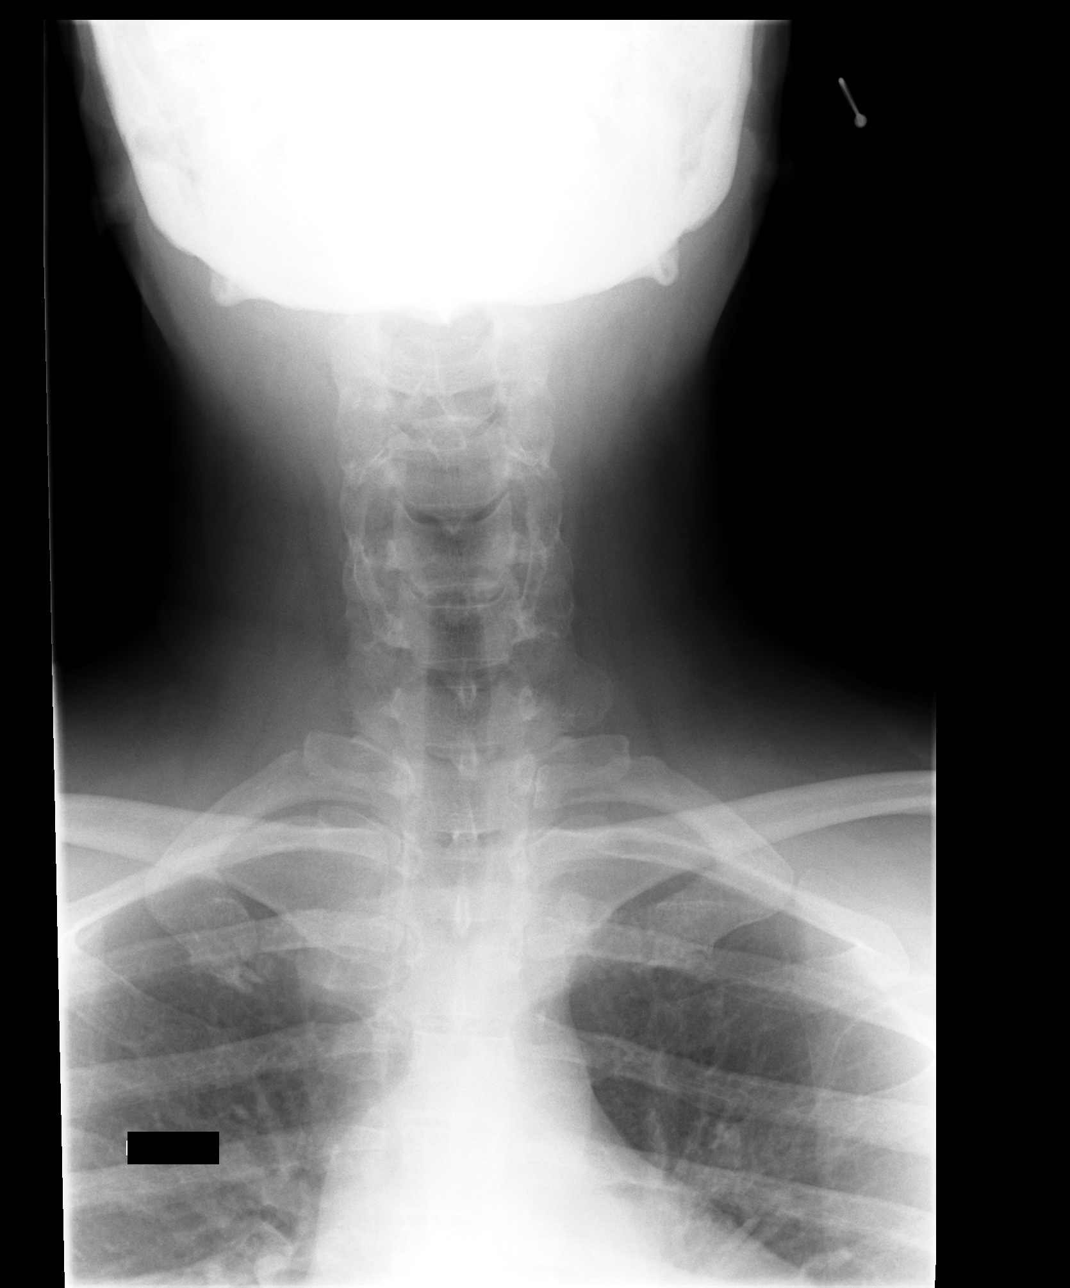

[view not recorded (2 of 3)]
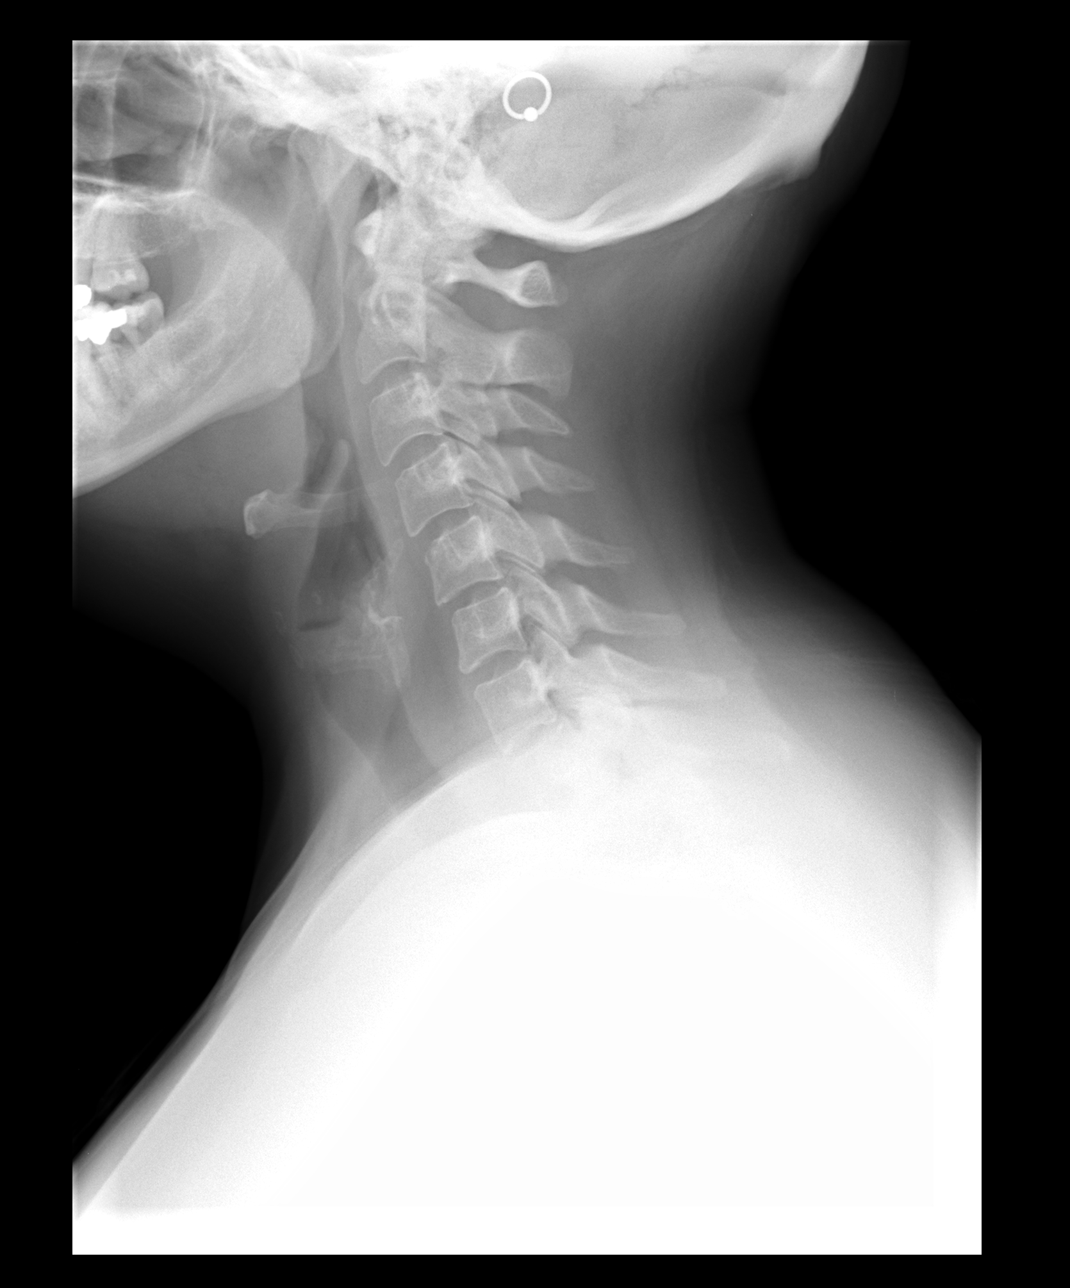

[view not recorded (3 of 3)]
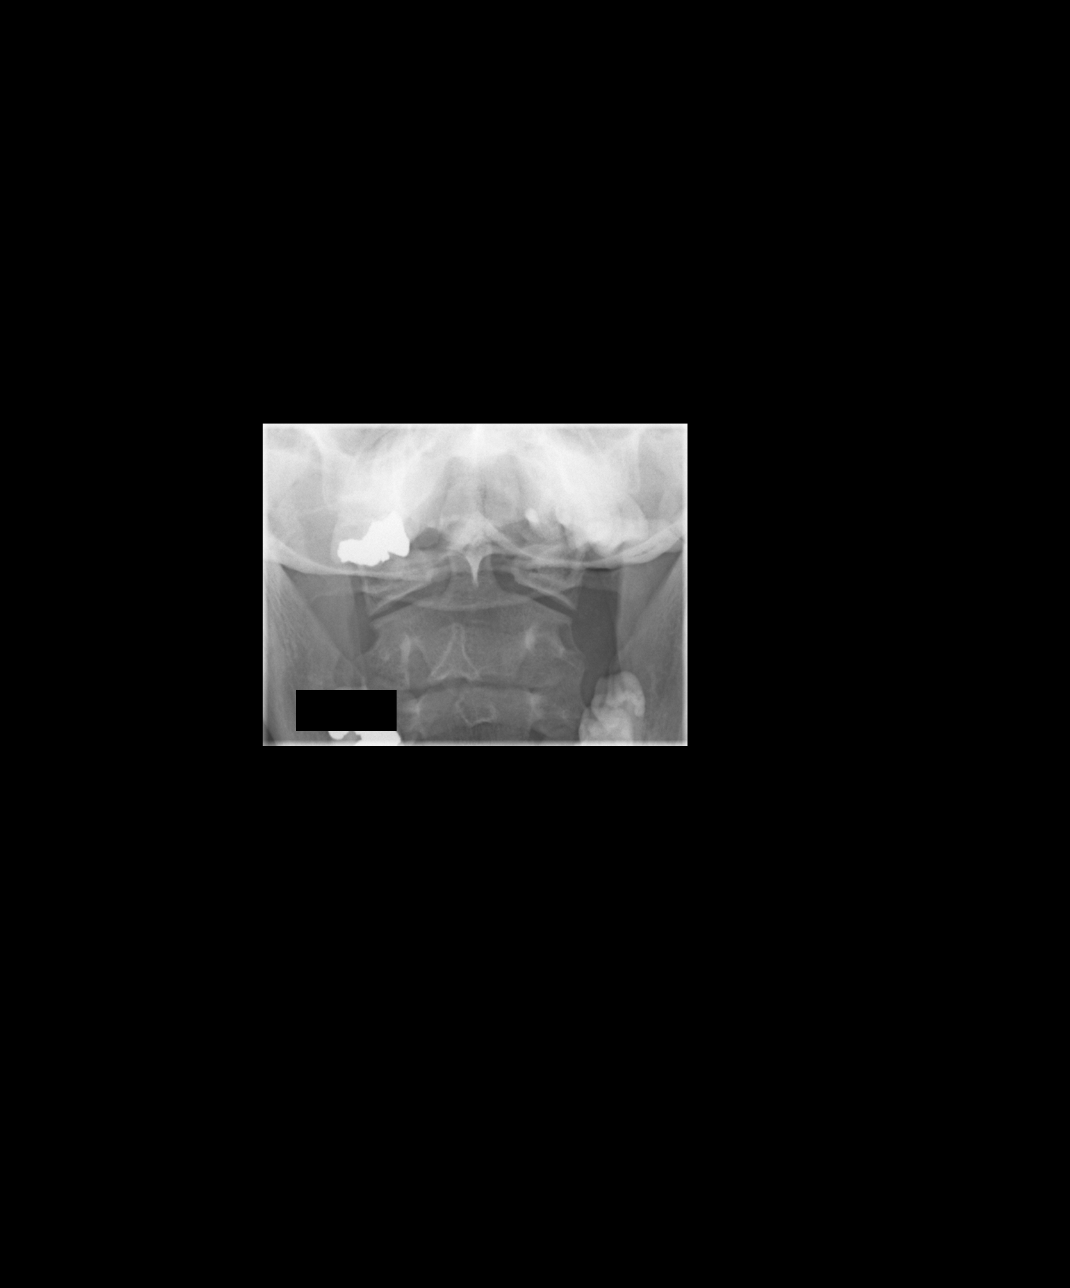

[3 of 3 positions shown; findings below may reference images not displayed]

FINDINGS: Mild straightening of cervical lordosis.  Prevertebral
soft tissues are within normal limits. Cervicothoracic junction
alignment is within normal limits.  Relatively preserved disc
spaces.  AP alignment and lung apices within normal limits.  C1-C2
alignment and odontoid within normal limits.
IMPRESSION: No acute osseous abnormality in the cervical spine.

## 2011-04-22 ENCOUNTER — Encounter: Payer: Self-pay | Admitting: Family Medicine

## 2011-04-22 ENCOUNTER — Other Ambulatory Visit: Payer: Self-pay | Admitting: Family Medicine

## 2011-04-22 MED ORDER — SITAGLIPTIN PHOSPHATE 100 MG PO TABS: 100.0000 mg | ORAL_TABLET | Freq: Every day | ORAL | Status: DC

## 2011-04-22 NOTE — Telephone Encounter (Signed)
Pt called and said she didn't understand why we had denied the refill of her Januvia medication for her diabetes. Plan:  Reviewed the pt chart and the last diabetic check was on 05-08-10, and the instructions at that appt was for the pt to fup again in 3-4 weeks.  I do not see that that follow up was done.  It has been almost a yr since the pt was last seen for her diabetes and now she is complaining of thirst, hunger, fatigue, and headaches.   Plan:  [facility] for the pt stating the symptoms were probably because her diabetes is not controlled, and she needs an appt she never kept.  Told to call the triage nurse so we can get her an appt, medication, and get her back under control. Jarvis Newcomer, LPN [facility]

## 2011-04-22 NOTE — Telephone Encounter (Signed)
Pt called a third time for her diabetic medication januvia 100 mg.  Completely out of med and a #30 day supply was sent to get pt back on since out of X 1 week, and appt tomorrow and will ask provider for script with refills on it. Jarvis Newcomer, LPN [facility]

## 2011-04-23 ENCOUNTER — Encounter: Payer: Self-pay | Admitting: Family Medicine

## 2011-04-23 ENCOUNTER — Ambulatory Visit (INDEPENDENT_AMBULATORY_CARE_PROVIDER_SITE_OTHER): Payer: Private Health Insurance - Indemnity | Admitting: Family Medicine

## 2011-04-23 DIAGNOSIS — E119 Type 2 diabetes mellitus without complications: Secondary | ICD-10-CM

## 2011-04-23 DIAGNOSIS — Z23 Encounter for immunization: Secondary | ICD-10-CM

## 2011-04-23 DIAGNOSIS — I10 Essential (primary) hypertension: Secondary | ICD-10-CM

## 2011-04-23 LAB — POCT GLYCOSYLATED HEMOGLOBIN (HGB A1C): Hemoglobin A1C: 5.8

## 2011-04-23 LAB — POCT UA - MICROALBUMIN

## 2011-04-23 MED ORDER — METOPROLOL SUCCINATE ER 100 MG PO TB24: 100.0000 mg | ORAL_TABLET | Freq: Every day | ORAL | Status: DC

## 2011-04-23 MED ORDER — PNEUMOCOCCAL VAC POLYVALENT 25 MCG/0.5ML IJ INJ: 0.5000 mL | INJECTION | INTRAMUSCULAR | Status: DC

## 2011-04-23 MED ORDER — SITAGLIPTIN PHOSPHATE 50 MG PO TABS: 50.0000 mg | ORAL_TABLET | Freq: Every day | ORAL | Status: DC

## 2011-04-23 NOTE — Assessment & Plan Note (Signed)
Doing fantastic. Will dec Januvia to 50mg  daily and recheck A1C in 3-4 months.  Urine micro was neg.  Eye exam is up to date.

## 2011-04-23 NOTE — Assessment & Plan Note (Signed)
Not well controlled. Today.  Will increase the metoprolol. Recheck in 1 months.

## 2011-04-23 NOTE — Progress Notes (Signed)
  Subjective:    Patient ID: Caroline Arnold, female    DOB: 1966-05-20, 45 y.o.   MRN: [id number]  Diabetes She presents for her follow-up diabetic visit. She has type 2 diabetes mellitus. Her disease course has been stable. There are no hypoglycemic associated symptoms. Pertinent negatives for diabetes include no chest pain, no polydipsia, no polyphagia and no polyuria. Symptoms are stable. There are no diabetic complications. Risk factors for coronary artery disease include no known risk factors. Current diabetic treatment includes oral agent (monotherapy). She is compliant with treatment all of the time (missed 6 days because ran out. ). Her weight is stable. She is following a generally healthy diet. She has not had a previous visit with a dietician. She rarely participates in exercise. There is no change in her home blood glucose trend. An ACE inhibitor/angiotensin II receptor blocker is not being taken. Eye exam is current.  Hypertension This is a chronic problem. The current episode started more than 1 year ago. The problem is unchanged. Pertinent negatives include no chest pain. Risk factors for coronary artery disease include diabetes mellitus. Past treatments include beta blockers, diuretics and calcium channel blockers. There are no compliance problems.       Review of Systems  Cardiovascular: Negative for chest pain.  Genitourinary: Negative for polyuria.  Hematological: Negative for polydipsia and polyphagia.       Objective:   Physical Exam  Constitutional: She is oriented to person, place, and time. She appears well-developed and well-nourished.  HENT:  Head: Normocephalic and atraumatic.  Cardiovascular: Normal rate, regular rhythm and normal heart sounds.   Pulmonary/Chest: Effort normal and breath sounds normal.  Neurological: She is alert and oriented to person, place, and time.  Skin: Skin is warm and dry.  Psychiatric: She has a normal mood and affect. Her behavior is  normal.          Assessment & Plan:

## 2011-05-14 ENCOUNTER — Other Ambulatory Visit: Payer: Self-pay | Admitting: Family Medicine

## 2011-05-14 ENCOUNTER — Encounter (HOSPITAL_COMMUNITY): Payer: Private Health Insurance - Indemnity | Admitting: Psychiatry

## 2011-06-29 ENCOUNTER — Telehealth: Payer: Self-pay | Admitting: Family Medicine

## 2011-06-29 NOTE — Telephone Encounter (Signed)
Tell her thank you. Unfortunately I don't really know any docs in that area.  I am sorry.

## 2011-06-29 NOTE — Telephone Encounter (Signed)
Patient called left a voicemail request to know if you know of any doctor's (pcp) in the [place] Area because she lives in that area and its hard for her to get to our office and get lab work done. Pt request to get a call back and she advised can leave a voicemail if she doesn't answer. She states she loves you as a doctor and wanted your recommendation if you are able to refer a pcp doctor

## 2011-06-30 ENCOUNTER — Telehealth: Payer: Self-pay | Admitting: *Deleted

## 2011-06-30 DIAGNOSIS — E785 Hyperlipidemia, unspecified: Secondary | ICD-10-CM

## 2011-06-30 DIAGNOSIS — I10 Essential (primary) hypertension: Secondary | ICD-10-CM

## 2011-06-30 NOTE — Telephone Encounter (Signed)
You can just go to the labs tab and reprint the lab order since she is taking it to a different lab than [facility] anyway.

## 2011-06-30 NOTE — Telephone Encounter (Signed)
Pt called and states she never got her labs done from aug so she needs new order and wants them mailed to her since she lives in [place].( for some reason i cannot put the order in it will not let me sign it. Can you please see if you can put the order in?

## 2011-07-01 NOTE — Telephone Encounter (Signed)
Pt.notified

## 2011-07-16 ENCOUNTER — Other Ambulatory Visit: Payer: Self-pay | Admitting: Family Medicine

## 2011-07-16 MED ORDER — TRIAMTERENE-HCTZ 37.5-25 MG PO TABS: 1.0000 | ORAL_TABLET | Freq: Every day | ORAL | Status: DC

## 2011-07-16 MED ORDER — METOPROLOL SUCCINATE ER 100 MG PO TB24: 100.0000 mg | ORAL_TABLET | Freq: Every day | ORAL | Status: DC

## 2011-07-16 MED ORDER — AMLODIPINE BESYLATE 10 MG PO TABS: 10.0000 mg | ORAL_TABLET | Freq: Every day | ORAL | Status: DC

## 2011-07-16 NOTE — Telephone Encounter (Signed)
Pt called and needs refill of all BP meds. Plan:    Sent 30 day supplies of :  Amlodipine, metoprolol, and triam-HCTZ medications.  Pt has sched appt on 08-17-11.  Jarvis Newcomer, LPN [facility]

## 2011-08-05 ENCOUNTER — Other Ambulatory Visit (HOSPITAL_COMMUNITY): Payer: Self-pay | Admitting: Psychiatry

## 2011-08-05 DIAGNOSIS — F319 Bipolar disorder, unspecified: Secondary | ICD-10-CM

## 2011-08-05 MED ORDER — LAMOTRIGINE 100 MG PO TABS: 100.0000 mg | ORAL_TABLET | Freq: Every day | ORAL | Status: DC

## 2011-08-06 ENCOUNTER — Encounter (HOSPITAL_COMMUNITY): Payer: Self-pay | Admitting: Psychiatry

## 2011-08-09 ENCOUNTER — Other Ambulatory Visit: Payer: Self-pay | Admitting: Family Medicine

## 2011-08-17 ENCOUNTER — Ambulatory Visit (INDEPENDENT_AMBULATORY_CARE_PROVIDER_SITE_OTHER): Payer: Private Health Insurance - Indemnity | Admitting: Psychiatry

## 2011-08-17 ENCOUNTER — Ambulatory Visit (INDEPENDENT_AMBULATORY_CARE_PROVIDER_SITE_OTHER): Payer: Private Health Insurance - Indemnity | Admitting: Family Medicine

## 2011-08-17 ENCOUNTER — Encounter: Payer: Self-pay | Admitting: Family Medicine

## 2011-08-17 ENCOUNTER — Encounter (HOSPITAL_COMMUNITY): Payer: Self-pay | Admitting: Psychiatry

## 2011-08-17 VITALS — BP 124/82 | Ht 63.5 in | Wt 201.0 lb

## 2011-08-17 DIAGNOSIS — F3189 Other bipolar disorder: Secondary | ICD-10-CM

## 2011-08-17 DIAGNOSIS — E538 Deficiency of other specified B group vitamins: Secondary | ICD-10-CM

## 2011-08-17 DIAGNOSIS — E119 Type 2 diabetes mellitus without complications: Secondary | ICD-10-CM

## 2011-08-17 DIAGNOSIS — F3181 Bipolar II disorder: Secondary | ICD-10-CM

## 2011-08-17 DIAGNOSIS — I10 Essential (primary) hypertension: Secondary | ICD-10-CM

## 2011-08-17 LAB — LIPID PANEL
HDL: 46 mg/dL (ref >39)
Total CHOL/HDL Ratio: 5.7 Ratio
VLDL: 72 mg/dL — ABNORMAL HIGH (ref 0–40)

## 2011-08-17 LAB — VITAMIN B12: Vitamin B-12: 323 pg/mL (ref 211–911)

## 2011-08-17 LAB — POCT GLYCOSYLATED HEMOGLOBIN (HGB A1C): Hemoglobin A1C: 5.7

## 2011-08-17 MED ORDER — LOSARTAN POTASSIUM-HCTZ 100-25 MG PO TABS: 1.0000 | ORAL_TABLET | Freq: Every day | ORAL | Status: DC

## 2011-08-17 NOTE — Progress Notes (Signed)
   [facility] Behavioral Health Follow-up Outpatient Visit  Caroline Arnold July 14, 1966   Subjective: The patient is a 45 year old female who has been followed by [facility] since August of 2011. She was previously in therapy with Merlene Morse from May 2011 up until last year. The patient is diagnosed with bipolar 2. She is currently stable on Lamictal and Zoloft. She takes Ativan intermittently if she has extreme insomnia to keep her from going into a hypomanic episode. She continues to work from home. Her main complaint today is that she and her husband aren't different work schedules. She works from 8 AM until 4 PM. He does not at home from work until midnight and then stays up to approximately 2 AM. She will not go to sleep until he comes to bed. Then she is very sedated during the day. She normally naps from 4 PM to 5 PM. Her mother-in-law has been visiting intermittently and doesn't believe in naps. That is thrown her off her schedule somewhat. She also expresses frustration that she does not get to spend quality time with her husband because of his work hours. She feels that she is doing well. She has had a few episodes of mild depression over the past few weeks. She states that the holidays usually make her cheer up.  Filed Vitals:   08/17/11 0908  BP: 124/82    Mental Status Examination  Appearance: Casually dressed Alert: Yes Attention: good  Cooperative: Yes Eye Contact: Good Speech: Increased but normal for the patient Psychomotor Activity: Normal Memory/Concentration: Intact Oriented: person, place, time/date and situation Mood: Euthymic Affect: Full Range Thought Processes and Associations: Logical Fund of Knowledge: Fair Thought Content: No suicidal or homicidal thoughts Insight: Fair Judgement: Fair  Diagnosis: Bipolar type II  Treatment Plan: At this point we will continue her on her current medications. The patient states she has not taken the Trileptal  that she had been on at our initial appointment in a long time. She is continued on the Lamictal and Zoloft. She uses the Ativan when necessary. Patient was 7 minutes late for appointment today. She reports that she listen Sandre Kitty now and is considering switching providers. I asked her to let me know if she does find a new prescribing physician. Otherwise I will see her back in 3 months.  Jamse Mead, MD

## 2011-08-17 NOTE — Progress Notes (Signed)
  Subjective:    Patient ID: Caroline Arnold, female    DOB: 04/07/1966, 45 y.o.   MRN: [id number]  Diabetes She presents for her follow-up diabetic visit. She has type 2 diabetes mellitus. Her disease course has been worsening. There are no hypoglycemic associated symptoms. Pertinent negatives for diabetes include no blurred vision, no chest pain, no foot paresthesias, no foot ulcerations, no polydipsia, no polyphagia, no polyuria and no visual change. There are no hypoglycemic complications. Symptoms are worsening. Pertinent negatives for diabetic complications include no retinopathy. Current diabetic treatment includes oral agent (monotherapy). She is compliant with treatment all of the time. She is following a generally healthy diet. She rarely participates in exercise. Her home blood glucose trend is fluctuating minimally. Her breakfast blood glucose range is generally 110-130 mg/dl. An ACE inhibitor/angiotensin II receptor blocker is not being taken. Eye exam is current.  Hypertension This is a chronic problem. The current episode started more than 1 year ago. The problem is unchanged. Pertinent negatives include no blurred vision, chest pain, palpitations or shortness of breath. There are no associated agents to hypertension. Past treatments include calcium channel blockers, diuretics and beta blockers. The current treatment provides significant improvement. There are no compliance problems.  There is no history of CAD/MI or retinopathy.      Review of Systems  Eyes: Negative for blurred vision.  Respiratory: Negative for shortness of breath.   Cardiovascular: Negative for chest pain and palpitations.  Genitourinary: Negative for polyuria.  Hematological: Negative for polydipsia and polyphagia.       Objective:   Physical Exam  Constitutional: She is oriented to person, place, and time. She appears well-developed and well-nourished.  HENT:  Head: Normocephalic and atraumatic.    Cardiovascular: Normal rate, regular rhythm and normal heart sounds.        No carotid bruits.   Pulmonary/Chest: Effort normal and breath sounds normal.  Musculoskeletal: She exhibits no edema.  Neurological: She is alert and oriented to person, place, and time.  Skin: Skin is warm and dry.  Psychiatric: She has a normal mood and affect. Her behavior is normal.          Assessment & Plan:  HTN- will d/c amlodipine and maxzide and change to losartan HCt bec of benefits of ARB in diabetics.  Didn't tolerate ACE 2nd to cough. F/u in 3 mo. Hopefully bp will be wel controlled. Continue toprol.    DM- Doing fantastic on the lower dose of Januvia.  A1C looks great! Eye exam is up to date. Foot exam performed today.  Lab Results  Component Value Date   HGBA1C 5.7 08/17/2011

## 2011-08-18 LAB — COMPLETE METABOLIC PANEL WITH GFR
ALT: 11 U/L (ref 0–35)
AST: 12 U/L (ref 0–37)
Alkaline Phosphatase: 109 U/L (ref 39–117)
GFR, Est Non African American: >89 mL/min
Glucose, Bld: 108 mg/dL — ABNORMAL HIGH (ref 70–99)
Sodium: 136 mEq/L (ref 135–145)
Total Bilirubin: 0.5 mg/dL (ref 0.3–1.2)
Total Protein: 7.2 g/dL (ref 6.0–8.3)

## 2011-08-19 ENCOUNTER — Other Ambulatory Visit: Payer: Self-pay | Admitting: Family Medicine

## 2011-08-19 MED ORDER — PRAVASTATIN SODIUM 40 MG PO TABS: 40.0000 mg | ORAL_TABLET | Freq: Every day | ORAL | Status: DC

## 2011-09-05 ENCOUNTER — Other Ambulatory Visit: Payer: Self-pay | Admitting: Family Medicine

## 2011-09-06 ENCOUNTER — Other Ambulatory Visit: Payer: Self-pay | Admitting: *Deleted

## 2011-09-06 MED ORDER — SITAGLIPTIN PHOSPHATE 50 MG PO TABS: 50.0000 mg | ORAL_TABLET | Freq: Every day | ORAL | Status: DC

## 2011-09-08 ENCOUNTER — Other Ambulatory Visit: Payer: Self-pay | Admitting: Family Medicine

## 2011-10-06 ENCOUNTER — Other Ambulatory Visit: Payer: Self-pay | Admitting: Family Medicine

## 2011-10-13 ENCOUNTER — Other Ambulatory Visit (HOSPITAL_COMMUNITY): Payer: Self-pay | Admitting: Psychiatry

## 2011-10-13 ENCOUNTER — Other Ambulatory Visit: Payer: Self-pay | Admitting: Family Medicine

## 2011-10-13 MED ORDER — SERTRALINE HCL 25 MG PO TABS: 25.0000 mg | ORAL_TABLET | Freq: Every day | ORAL | Status: DC

## 2011-10-22 ENCOUNTER — Ambulatory Visit: Payer: Private Health Insurance - Indemnity | Admitting: Family Medicine

## 2011-10-22 DIAGNOSIS — Z0289 Encounter for other administrative examinations: Secondary | ICD-10-CM

## 2011-11-19 ENCOUNTER — Ambulatory Visit (HOSPITAL_COMMUNITY): Payer: Private Health Insurance - Indemnity | Admitting: Psychiatry

## 2011-11-19 ENCOUNTER — Ambulatory Visit: Payer: Private Health Insurance - Indemnity | Admitting: Family Medicine

## 2011-11-26 ENCOUNTER — Ambulatory Visit (HOSPITAL_COMMUNITY): Payer: Private Health Insurance - Indemnity | Admitting: Psychiatry

## 2011-12-03 ENCOUNTER — Ambulatory Visit: Payer: Private Health Insurance - Indemnity | Admitting: Family Medicine

## 2011-12-10 ENCOUNTER — Encounter: Payer: Self-pay | Admitting: Family Medicine

## 2011-12-10 ENCOUNTER — Encounter (HOSPITAL_COMMUNITY): Payer: Self-pay | Admitting: Psychiatry

## 2011-12-10 ENCOUNTER — Ambulatory Visit (INDEPENDENT_AMBULATORY_CARE_PROVIDER_SITE_OTHER): Payer: Private Health Insurance - Indemnity | Admitting: Psychiatry

## 2011-12-10 ENCOUNTER — Ambulatory Visit (INDEPENDENT_AMBULATORY_CARE_PROVIDER_SITE_OTHER): Payer: Private Health Insurance - Indemnity | Admitting: Family Medicine

## 2011-12-10 VITALS — BP 132/84 | Ht 63.5 in | Wt 208.0 lb

## 2011-12-10 DIAGNOSIS — E785 Hyperlipidemia, unspecified: Secondary | ICD-10-CM

## 2011-12-10 DIAGNOSIS — I10 Essential (primary) hypertension: Secondary | ICD-10-CM

## 2011-12-10 DIAGNOSIS — D179 Benign lipomatous neoplasm, unspecified: Secondary | ICD-10-CM

## 2011-12-10 DIAGNOSIS — J309 Allergic rhinitis, unspecified: Secondary | ICD-10-CM

## 2011-12-10 DIAGNOSIS — F3189 Other bipolar disorder: Secondary | ICD-10-CM

## 2011-12-10 DIAGNOSIS — E119 Type 2 diabetes mellitus without complications: Secondary | ICD-10-CM

## 2011-12-10 DIAGNOSIS — F3181 Bipolar II disorder: Secondary | ICD-10-CM

## 2011-12-10 LAB — POCT GLYCOSYLATED HEMOGLOBIN (HGB A1C): Hemoglobin A1C: 5.7

## 2011-12-10 NOTE — Progress Notes (Signed)
   [facility] Behavioral Health Follow-up Outpatient Visit  Caroline Arnold 02/16/1966   Subjective: The patient is a 46 year old female who has been followed by [facility] since August of 2011. She was previously in therapy with Merlene Morse from May 2011 up until last year. The patient is diagnosed with bipolar 2. She is currently stable on Lamictal and Zoloft. She takes Ativan intermittently if she has extreme insomnia to keep her from going into a hypomanic episode. She continues to work from home. 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 There've been some issues with people wanting accountability for her work hours. This causes her additional stress. Her husband has been traveling, and is on a two-week trip. Even though she is an introvert she still wants to talk to people. They try and communicate twice a day. She reports the main stress of her anxiety is the fact that she and her husband have not had sex in 2 years. She is concerned about his testosterone level. She endorses good sleep and appetite. She is happy with her current medical regimen.  Filed Vitals:   12/10/11 1052  BP: 132/84    Mental Status Examination  Appearance: Casually dressed Alert: Yes Attention: good  Cooperative: Yes Eye Contact: Good Speech: Increased but normal for the patient Psychomotor Activity: Normal Memory/Concentration: Intact Oriented: person, place, time/date and situation Mood: Euthymic Affect: Full Range Thought Processes and Associations: Logical Fund of Knowledge: Fair Thought Content: No suicidal or homicidal thoughts Insight: Fair Judgement: Fair  Diagnosis: Bipolar type II  Treatment Plan: At this point we will continue her on her current medications.  I will see her back in 3 months.  Jamse Mead, MD

## 2011-12-10 NOTE — Progress Notes (Signed)
  Subjective:    Patient ID: Caroline Arnold, female    DOB: 1966/06/30, 47 y.o.   MRN: [id number]  Diabetes She presents for her follow-up diabetic visit. She has type 2 diabetes mellitus. Her disease course has been stable. There are no hypoglycemic associated symptoms. Pertinent negatives for diabetes include no blurred vision, no foot paresthesias, no foot ulcerations, no polydipsia, no polyuria and no visual change. There are no hypoglycemic complications. Symptoms are stable. She is compliant with treatment all of the time. Her weight is stable. She is following a generally healthy diet. There is no change in her home blood glucose trend. An ACE inhibitor/angiotensin II receptor blocker is being taken.    Urticaria - She is not on Allegra daily for this. Has really been helping but still gets watery itchy eyes and itchy nose.   She says she has a soft mass on her right low back. It has not bothered her and has been there for a very long time but she just wanted me to look at it.  She also complains of some occasional lumps under her axilla. She says they tend to come and go but when they are present they can be sore. She says she recently changed deodorants to a more natural deodorant and says her symptoms and the swelling have resolved. She says the lungs have cleared and she is no longer tender. She just wonders what I thought it might be.  Review of Systems  Eyes: Negative for blurred vision.  Genitourinary: Negative for polyuria.  Hematological: Negative for polydipsia.       Objective:   Physical Exam  Constitutional: She is oriented to person, place, and time. She appears well-developed and well-nourished.  HENT:  Head: Normocephalic and atraumatic.  Cardiovascular: Normal rate, regular rhythm and normal heart sounds.   Pulmonary/Chest: Effort normal and breath sounds normal.  Musculoskeletal:       She has a very large lipoma on her right mid-back.   Neurological: She is  alert and oriented to person, place, and time.  Skin: Skin is warm and dry.  Psychiatric: She has a normal mood and affect. Her behavior is normal.          Assessment & Plan:  DM- She wants to stick with the Januvia. She is still working on changing her diet.  F/u in 4 months.  Controlled.   Lab Results  Component Value Date   HGBA1C 5.7 12/10/2011    Hyperlpidemia - Due ot recheck lipid levels. Fastig today. Taking her statin.   HTN - Well controlled.  Continue current regimen. If loses weight then consider cutting amlodipine in half.   AR - Consider adding a nasal steroid to her allegra for better control. She says she is currently trying Nettle tea.   Lipoma - on hre back.  Discussed benign. If she would like removed I be happy to refer her to general surgeon. She said this time it is not bothering her and she will just need it.  Based on her symptoms above I suspect that she was giving some intermittent axillary lymphadenopathy. Possibly exacerbated by her. Since it has resolved after changing her meds. She has no swollen lymph nodes today her me to examine. Also her mammogram is up-to-date.

## 2011-12-11 LAB — COMPLETE METABOLIC PANEL WITH GFR
ALT: 12 U/L (ref 0–35)
AST: 15 U/L (ref 0–37)
Albumin: 4.6 g/dL (ref 3.5–5.2)
Alkaline Phosphatase: 112 U/L (ref 39–117)
BUN: 8 mg/dL (ref 6–23)
CO2: 24 mEq/L (ref 19–32)
Calcium: 9.3 mg/dL (ref 8.4–10.5)
Chloride: 97 mEq/L (ref 96–112)
Creat: 0.57 mg/dL (ref 0.50–1.10)
GFR, Est African American: >89 mL/min
GFR, Est Non African American: >89 mL/min
Glucose, Bld: 91 mg/dL (ref 70–99)
Potassium: 3.6 mEq/L (ref 3.5–5.3)
Sodium: 136 mEq/L (ref 135–145)
Total Bilirubin: 0.7 mg/dL (ref 0.3–1.2)
Total Protein: 7 g/dL (ref 6.0–8.3)

## 2011-12-11 LAB — LIPID PANEL
Cholesterol: 220 mg/dL — ABNORMAL HIGH (ref 0–200)
HDL: 44 mg/dL (ref >39)
LDL Cholesterol: 115 mg/dL — ABNORMAL HIGH (ref 0–99)
Total CHOL/HDL Ratio: 5 Ratio
Triglycerides: 307 mg/dL — ABNORMAL HIGH (ref <150)
VLDL: 61 mg/dL — ABNORMAL HIGH (ref 0–40)

## 2011-12-13 ENCOUNTER — Other Ambulatory Visit: Payer: Self-pay | Admitting: *Deleted

## 2011-12-13 MED ORDER — PRAVASTATIN SODIUM 80 MG PO TABS: 80.0000 mg | ORAL_TABLET | Freq: Every evening | ORAL | Status: DC

## 2011-12-31 ENCOUNTER — Other Ambulatory Visit: Payer: Self-pay | Admitting: Family Medicine

## 2012-01-25 ENCOUNTER — Other Ambulatory Visit (HOSPITAL_COMMUNITY): Payer: Self-pay | Admitting: Psychiatry

## 2012-02-01 ENCOUNTER — Other Ambulatory Visit: Payer: Self-pay | Admitting: Family Medicine

## 2012-03-10 ENCOUNTER — Ambulatory Visit (HOSPITAL_COMMUNITY): Payer: Self-pay | Admitting: Psychiatry

## 2012-03-21 ENCOUNTER — Other Ambulatory Visit (HOSPITAL_COMMUNITY): Payer: Self-pay | Admitting: Psychiatry

## 2012-03-21 MED ORDER — SERTRALINE HCL 25 MG PO TABS: 25.0000 mg | ORAL_TABLET | Freq: Every day | ORAL | Status: DC

## 2012-03-21 MED ORDER — LAMOTRIGINE 100 MG PO TABS: 100.0000 mg | ORAL_TABLET | Freq: Every day | ORAL | Status: DC

## 2012-03-31 ENCOUNTER — Encounter (HOSPITAL_COMMUNITY): Payer: Self-pay | Admitting: Psychiatry

## 2012-03-31 ENCOUNTER — Ambulatory Visit (INDEPENDENT_AMBULATORY_CARE_PROVIDER_SITE_OTHER): Payer: Private Health Insurance - Indemnity | Admitting: Psychiatry

## 2012-03-31 VITALS — BP 124/82 | Ht 63.5 in | Wt 207.0 lb

## 2012-03-31 DIAGNOSIS — F3189 Other bipolar disorder: Secondary | ICD-10-CM

## 2012-03-31 DIAGNOSIS — F3181 Bipolar II disorder: Secondary | ICD-10-CM
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
## 2012-04-07 ENCOUNTER — Ambulatory Visit: Payer: Self-pay | Admitting: Family Medicine

## 2012-04-14 ENCOUNTER — Ambulatory Visit: Payer: Self-pay | Admitting: Family Medicine

## 2012-04-14 DIAGNOSIS — Z0289 Encounter for other administrative examinations: Secondary | ICD-10-CM

## 2012-07-07 ENCOUNTER — Ambulatory Visit (HOSPITAL_COMMUNITY): Payer: Self-pay | Admitting: Psychiatry

## 2012-07-13 ENCOUNTER — Ambulatory Visit (HOSPITAL_COMMUNITY): Payer: Self-pay | Admitting: Psychiatry

## 2012-07-20 ENCOUNTER — Ambulatory Visit (INDEPENDENT_AMBULATORY_CARE_PROVIDER_SITE_OTHER): Payer: Private Health Insurance - Indemnity | Admitting: Psychiatry

## 2012-07-20 ENCOUNTER — Encounter (HOSPITAL_COMMUNITY): Payer: Self-pay | Admitting: Psychiatry

## 2012-07-20 VITALS — BP 126/84 | Ht 63.5 in | Wt 209.0 lb

## 2012-07-20 DIAGNOSIS — F3181 Bipolar II disorder: Secondary | ICD-10-CM

## 2012-07-20 DIAGNOSIS — F3189 Other bipolar disorder: Secondary | ICD-10-CM

## 2012-07-20 NOTE — Progress Notes (Signed)
   [facility] Behavioral Health Follow-up Outpatient Visit  Caroline Arnold 1966/07/15   Subjective: The patient is a 46 year old female who has been followed by [facility] since August of 2011. She was previously in therapy with Merlene Morse from May 2011 up until 2012. The patient is diagnosed with bipolar 2. She is currently stable on Lamictal and Zoloft. She takes Ativan intermittently if she has extreme insomnia to keep her from going into a hypomanic episode. She continues to work from home. Work is going much better. She is more amenable with her colleagues. She no longer feels threatened. The patient reports she was having sleep issues. She decided to go off all caffeinated beverages. She then began having crying spells and was very tired. She now has a cup of caffeinated tea each morning, and appears to be working. Her husband is now being tested for his testosterone, and will start therapy soon. He is also been provided with Viagra. The patient is hoping is going to make a difference. The patient endorses good appetite. Her confrontation black female who went well. She feels as though she is remaining stable on her medication.  Filed Vitals:   07/20/12 1442  BP: 126/84    Mental Status Examination  Appearance: Casually dressed Alert: Yes Attention: good  Cooperative: Yes Eye Contact: Good Speech: Increased but normal for the patient Psychomotor Activity: Normal Memory/Concentration: Intact Oriented: person, place, time/date and situation Mood: Euthymic Affect: Full Range Thought Processes and Associations: Logical Fund of Knowledge: Fair Thought Content: No suicidal or homicidal thoughts Insight: Fair Judgement: Fair  Diagnosis: Bipolar type II  Treatment Plan: I will not make any changes.  I will see her back in 3 months. Patient may call with concerns.  Jamse Mead, MD

## 2012-07-26 ENCOUNTER — Telehealth: Payer: Self-pay | Admitting: Family Medicine

## 2012-07-26 NOTE — Telephone Encounter (Signed)
Please call patient and her monitor she is overdue for her diabetic eye exam. The last one I have is from June of 2012. She also needs to schedule a followup urine the office.

## 2012-07-26 NOTE — Telephone Encounter (Signed)
Left message on vm

## 2012-10-20 ENCOUNTER — Ambulatory Visit (HOSPITAL_COMMUNITY): Payer: Self-pay | Admitting: Psychiatry

## 2012-11-02 ENCOUNTER — Other Ambulatory Visit (HOSPITAL_COMMUNITY): Payer: Self-pay | Admitting: Psychiatry

## 2012-11-03 ENCOUNTER — Ambulatory Visit (HOSPITAL_COMMUNITY): Payer: Self-pay | Admitting: Psychiatry

## 2012-11-17 ENCOUNTER — Ambulatory Visit (HOSPITAL_COMMUNITY): Payer: Self-pay | Admitting: Psychiatry

## 2012-12-01 ENCOUNTER — Ambulatory Visit (INDEPENDENT_AMBULATORY_CARE_PROVIDER_SITE_OTHER): Payer: Private Health Insurance - Indemnity | Admitting: Psychiatry

## 2012-12-01 ENCOUNTER — Encounter (HOSPITAL_COMMUNITY): Payer: Self-pay | Admitting: Psychiatry

## 2012-12-01 VITALS — BP 122/80 | Ht 63.5 in | Wt 212.0 lb

## 2012-12-01 DIAGNOSIS — F3181 Bipolar II disorder: Secondary | ICD-10-CM

## 2012-12-01 DIAGNOSIS — F3189 Other bipolar disorder: Secondary | ICD-10-CM
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
## 2012-12-06 ENCOUNTER — Other Ambulatory Visit (HOSPITAL_COMMUNITY): Payer: Self-pay | Admitting: Psychiatry

## 2012-12-06 MED ORDER — LAMOTRIGINE 100 MG PO TABS: 100.0000 mg | ORAL_TABLET | Freq: Every day | ORAL | Status: DC

## 2012-12-18 ENCOUNTER — Other Ambulatory Visit: Payer: Self-pay | Admitting: Family Medicine

## 2013-01-17 ENCOUNTER — Other Ambulatory Visit (HOSPITAL_COMMUNITY): Payer: Self-pay | Admitting: Psychiatry

## 2013-01-17 MED ORDER — SERTRALINE HCL 25 MG PO TABS: 25.0000 mg | ORAL_TABLET | Freq: Every day | ORAL | Status: DC

## 2013-03-02 ENCOUNTER — Ambulatory Visit (HOSPITAL_COMMUNITY): Payer: Self-pay | Admitting: Psychiatry

## 2013-03-12 ENCOUNTER — Encounter (HOSPITAL_COMMUNITY): Payer: Self-pay | Admitting: Psychiatry

## 2013-03-12 ENCOUNTER — Ambulatory Visit (INDEPENDENT_AMBULATORY_CARE_PROVIDER_SITE_OTHER): Payer: Private Health Insurance - Indemnity | Admitting: Psychiatry

## 2013-03-12 VITALS — BP 122/82 | Ht 63.5 in | Wt 214.0 lb

## 2013-03-12 DIAGNOSIS — F3189 Other bipolar disorder: Secondary | ICD-10-CM

## 2013-03-12 DIAGNOSIS — F3181 Bipolar II disorder: Secondary | ICD-10-CM

## 2013-03-12 MED ORDER — LORAZEPAM 1 MG PO TABS: 1.0000 mg | ORAL_TABLET | Freq: Every day | ORAL | Status: AC

## 2013-03-12 NOTE — Progress Notes (Signed)
[facility] Behavioral Health Follow-up Outpatient Visit  Caroline Arnold [birth year]   Subjective: The patient is a 47 year old female who has been followed by [facility] since August of 2011. She was previously in therapy with Merlene Morse from May 2011 up until 2012. The patient is diagnosed with bipolar 2. At her last appointment, I did not make any changes and continued her Lamictal and Zoloft. She presents today. Since her last appointment, her husband has had a cancer scare. Her husband is basically now by knowing very proud of skin cancer. They discovered a melanoma on his face. The melanoma was removed along with lymph nodes. They believe that he got it all, but he has a significant scar down the right side of his face. Patient continues to be overloaded at work. 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 Sometimes it becomes frustrating. The patient is going to visit her in-laws in a few weeks. She is a little anxious about this. Overall her mood is doing well. She endorses good sleep and appetite. Weight is up 2 pounds today. There've been no manic episodes. She has no other concerns today.  Filed Vitals:   03/12/13 0939  BP: 122/82   Active Ambulatory Problems    Diagnosis Date Noted  . DIABETES MELLITUS, CONTROLLED 05/08/2010  . B12 DEFICIENCY 02/27/2010  . HYPERLIPIDEMIA 01/16/2010  . BIPOLAR DISORDER UNSPECIFIED 01/16/2010  . HYPERTENSION, BENIGN 01/16/2010  . ESOPHAGEAL REFLUX 02/27/2010  . TRANSAMINASES, SERUM, ELEVATED 05/08/2010  . SHOULDER PAIN 09/25/2010  . NUMBNESS, HAND 09/25/2010  . Lipoma 12/10/2011   Resolved Ambulatory Problems    Diagnosis Date Noted  . CUTANEOUS ERUPTIONS, DRUG-INDUCED 05/29/2010   Past Medical History  Diagnosis Date  . PCOS (polycystic ovarian syndrome)   . Bipolar disorder   . HTN (hypertension)   . GERD (gastroesophageal reflux disease)    Current Outpatient Prescriptions on File Prior to  Visit  Medication Sig Dispense Refill  . amLODipine (NORVASC) 10 MG tablet TAKE 1 TABLET BY MOUTH DAILY  30 tablet  3  . fexofenadine (ALLEGRA) 180 MG tablet Take 180 mg by mouth daily.      Marland Kitchen JANUVIA 50 MG tablet TAKE 1 TABLET BY MOUTH DAILY  30 tablet  0  . lamoTRIgine (LAMICTAL) 100 MG tablet Take 1 tablet (100 mg total) by mouth daily.  90 tablet  1  . losartan-hydrochlorothiazide (HYZAAR) 100-25 MG per tablet TAKE 1 TABLET BY MOUTH DAILY  30 tablet  3  . Melatonin 5 MG TABS Take 10 mg by mouth at bedtime.        . metoprolol succinate (TOPROL-XL) 100 MG 24 hr tablet TAKE 1 TABLET BY MOUTH DAILY  30 tablet  3  . omeprazole (PRILOSEC) 20 MG capsule Take 20 mg by mouth 2 (two) times daily.        . pravastatin (PRAVACHOL) 40 MG tablet TAKE 1 TABLET BY MOUTH EVERY NIGHT AT BEDTIME  30 tablet  2  . pravastatin (PRAVACHOL) 80 MG tablet Take 1 tablet (80 mg total) by mouth every evening.  30 tablet  3  . sertraline (ZOLOFT) 25 MG tablet Take 1 tablet (25 mg total) by mouth daily.  90 tablet  1  . [DISCONTINUED] Oxcarbazepine (TRILEPTAL) 300 MG tablet Take 300 mg by mouth daily.         No current facility-administered medications on file prior to visit.   Review of Systems - General ROS:  negative for - sleep disturbance or weight loss Psychological ROS: negative for - anxiety or depression Cardiovascular ROS: no chest pain or dyspnea on exertion Musculoskeletal ROS: negative for - gait disturbance or muscular weakness Neurological ROS: negative for - dizziness, headaches or seizures  Mental Status Examination  Appearance: Casually dressed Alert: Yes Attention: good  Cooperative: Yes Eye Contact: Good Speech: Increased but normal for the patient Psychomotor Activity: Normal Memory/Concentration: Intact Oriented: person, place, time/date and situation Mood: Euthymic Affect: Full Range Thought Processes and Associations: Logical Fund of Knowledge: Fair Thought Content: No suicidal or  homicidal thoughts Insight: Fair Judgement: Fair  Diagnosis: Bipolar type II  Treatment Plan: I will not make any changes. I will continue the Zoloft and Lamictal. I will see her back in 3 months. I have provided a new prescription for Ativan since she has not filled that since August of 2012. Patient may call with concerns.  Jamse Mead, MD

## 2013-03-29 ENCOUNTER — Other Ambulatory Visit: Payer: Self-pay

## 2013-06-15 ENCOUNTER — Ambulatory Visit (HOSPITAL_COMMUNITY): Payer: Private Health Insurance - Indemnity | Admitting: Psychiatry

## 2013-06-20 ENCOUNTER — Ambulatory Visit (INDEPENDENT_AMBULATORY_CARE_PROVIDER_SITE_OTHER): Payer: Private Health Insurance - Indemnity | Admitting: Psychiatry

## 2013-06-20 VITALS — BP 122/78 | Ht 63.5 in | Wt 219.0 lb

## 2013-06-20 DIAGNOSIS — F3189 Other bipolar disorder: Secondary | ICD-10-CM

## 2013-06-20 DIAGNOSIS — F3181 Bipolar II disorder: Secondary | ICD-10-CM

## 2013-06-21 ENCOUNTER — Encounter (HOSPITAL_COMMUNITY): Payer: Self-pay | Admitting: Psychiatry

## 2013-06-21 NOTE — Progress Notes (Signed)
[facility] Follow-up Outpatient Visit  MIRIELLE BYRUM Jun 08, 1966   Subjective: The patient is a 47 year old female who has been followed by [facility] since August of 2011. She was previously in therapy with Merlene Morse from May 2011 up until 2012. The patient is diagnosed with bipolar 2. At her last appointment, I did not make any changes and continued her Lamictal and Zoloft. She presents today. She has started therapy with a new therapist in [facility]. She would eventually like to make it couple's counseling. She still has issues with her husband. They have not had sexual relations in 4 years. She tells him that she is willing to do anything he wants, but he's not interested. The patient is up another 5 pounds today. She reports that work is going okay, but stressful. The patient reports that since her last appointment, her 46 year old [place] died. She has a new tattoo to commemorate it. She did cry a lot, but appropriately. Her mood has been stable. She endorses good sleep and appetite. There no suicidal thoughts.  Filed Vitals:   06/21/13 0904  BP: 122/78   Active Ambulatory Problems    Diagnosis Date Noted  . DIABETES MELLITUS, CONTROLLED 05/08/2010  . B12 DEFICIENCY 02/27/2010  . HYPERLIPIDEMIA 01/16/2010  . BIPOLAR DISORDER UNSPECIFIED 01/16/2010  . HYPERTENSION, BENIGN 01/16/2010  . ESOPHAGEAL REFLUX 02/27/2010  . TRANSAMINASES, SERUM, ELEVATED 05/08/2010  . SHOULDER PAIN 09/25/2010  . NUMBNESS, HAND 09/25/2010  . Lipoma 12/10/2011   Resolved Ambulatory Problems    Diagnosis Date Noted  . CUTANEOUS ERUPTIONS, DRUG-INDUCED 05/29/2010   Past Medical History  Diagnosis Date  . PCOS (polycystic ovarian syndrome)   . Bipolar disorder   . HTN (hypertension)   . GERD (gastroesophageal reflux disease)    Current Outpatient Prescriptions on File Prior to Visit  Medication Sig Dispense Refill  . amLODipine (NORVASC) 10 MG tablet TAKE 1 TABLET  BY MOUTH DAILY  30 tablet  3  . fexofenadine (ALLEGRA) 180 MG tablet Take 180 mg by mouth daily.      Marland Kitchen JANUVIA 50 MG tablet TAKE 1 TABLET BY MOUTH DAILY  30 tablet  0  . lamoTRIgine (LAMICTAL) 100 MG tablet Take 1 tablet (100 mg total) by mouth daily.  90 tablet  1  . LORazepam (ATIVAN) 1 MG tablet Take 1 tablet (1 mg total) by mouth at bedtime.  30 tablet  1  . losartan-hydrochlorothiazide (HYZAAR) 100-25 MG per tablet TAKE 1 TABLET BY MOUTH DAILY  30 tablet  3  . Melatonin 5 MG TABS Take 10 mg by mouth at bedtime.        . metoprolol succinate (TOPROL-XL) 100 MG 24 hr tablet TAKE 1 TABLET BY MOUTH DAILY  30 tablet  3  . omeprazole (PRILOSEC) 20 MG capsule Take 20 mg by mouth 2 (two) times daily.        . pravastatin (PRAVACHOL) 40 MG tablet TAKE 1 TABLET BY MOUTH EVERY NIGHT AT BEDTIME  30 tablet  2  . pravastatin (PRAVACHOL) 80 MG tablet Take 1 tablet (80 mg total) by mouth every evening.  30 tablet  3  . sertraline (ZOLOFT) 25 MG tablet Take 1 tablet (25 mg total) by mouth daily.  90 tablet  1  . [DISCONTINUED] Oxcarbazepine (TRILEPTAL) 300 MG tablet Take 300 mg by mouth daily.         No current facility-administered medications on file prior to visit.   Review of Systems - General  ROS: negative for - sleep disturbance or weight loss Psychological ROS: negative for - anxiety or depression Cardiovascular ROS: no chest pain or dyspnea on exertion Musculoskeletal ROS: negative for - gait disturbance or muscular weakness Neurological ROS: negative for - dizziness, headaches or seizures  Mental Status Examination  Appearance: Casually dressed Alert: Yes Attention: good  Cooperative: Yes Eye Contact: Good Speech: Increased but normal for the patient Psychomotor Activity: Normal Memory/Concentration: Intact Oriented: person, place, time/date and situation Mood: Euthymic Affect: Full Range Thought Processes and Associations: Logical Fund of Knowledge: Fair Thought Content: No  suicidal or homicidal thoughts Insight: Fair Judgement: Fair  Diagnosis: Bipolar type II  Treatment Plan: I will not make any changes. I will continue the Zoloft and Lamictal. She will be scheduled back in 3 months.  Patient may call with concerns.  Jamse Mead, MD

## 2013-08-08 ENCOUNTER — Other Ambulatory Visit (HOSPITAL_COMMUNITY): Payer: Self-pay | Admitting: Psychiatry

## 2013-08-08 DIAGNOSIS — F319 Bipolar disorder, unspecified: Secondary | ICD-10-CM

## 2013-08-15 NOTE — Telephone Encounter (Signed)
Refill medications. Called patient, informed her of the same.

## 2013-08-22 ENCOUNTER — Other Ambulatory Visit: Payer: Self-pay | Admitting: Family Medicine

## 2013-09-21 ENCOUNTER — Ambulatory Visit (HOSPITAL_COMMUNITY): Payer: Self-pay | Admitting: Psychiatry

## 2013-09-28 ENCOUNTER — Other Ambulatory Visit: Payer: Self-pay | Admitting: Family Medicine

## 2013-10-11 ENCOUNTER — Other Ambulatory Visit: Payer: Self-pay | Admitting: Family Medicine

## 2013-10-19 ENCOUNTER — Ambulatory Visit (HOSPITAL_COMMUNITY): Payer: Self-pay | Admitting: Psychiatry

## 2013-11-08 ENCOUNTER — Other Ambulatory Visit: Payer: Self-pay | Admitting: Family Medicine

## 2013-11-08 ENCOUNTER — Other Ambulatory Visit (HOSPITAL_COMMUNITY): Payer: Self-pay | Admitting: Psychiatry

## 2013-11-16 NOTE — Telephone Encounter (Signed)
Refilled medications. Attempted to call patient to inform her of the same but no answer of her phone.

## 2013-11-23 ENCOUNTER — Ambulatory Visit (HOSPITAL_COMMUNITY): Payer: Self-pay | Admitting: Psychiatry

## 2017-08-23 ENCOUNTER — Ambulatory Visit (INDEPENDENT_AMBULATORY_CARE_PROVIDER_SITE_OTHER): Payer: Commercial Managed Care - PPO | Admitting: Neurology

## 2017-08-23 ENCOUNTER — Encounter: Payer: Self-pay | Admitting: Neurology

## 2017-08-23 VITALS — BP 108/70 | HR 77 | Ht 62.75 in | Wt 211.0 lb

## 2017-08-23 DIAGNOSIS — R269 Unspecified abnormalities of gait and mobility: Secondary | ICD-10-CM

## 2017-08-23 DIAGNOSIS — I63113 Cerebral infarction due to embolism of bilateral vertebral arteries: Secondary | ICD-10-CM

## 2017-08-23 MED ORDER — CLOPIDOGREL BISULFATE 75 MG PO TABS: 75.0000 mg | ORAL_TABLET | Freq: Every day | ORAL | 3 refills | Status: DC

## 2017-08-23 MED ORDER — ROSUVASTATIN CALCIUM 10 MG PO TABS: 10.0000 mg | ORAL_TABLET | Freq: Every day | ORAL | 4 refills | Status: DC

## 2017-08-23 NOTE — Progress Notes (Addendum)
PATIENT: Caroline Arnold DOB: 1965/09/29  Chief Complaint  Patient presents with  . Cerebrovascular Accident    She is here with her husband, Linna Hoff.  Reports experiencing dizziness and double vision that prompted an ED visit to [facility] on 08/11/17.  She was informed that she had a stroke.  She was discharged on Plavix '75mg'$ , aspirin '81mg'$  and Crestor '10mg'$  daily.  She is still doing home PT and OT twice weekly.  Marland Kitchen PCP    Windell Hummingbird, PA-C     HISTORICAL  ESME Arnold is a 51 years old female, seen in refer by her primary care PA Windell Hummingbird, for evaluation of stroke, initial evaluation was on August 26, 2017.  She has history of hypertension, diabetes, hyperlipidemia, was not on any antiplatelet agent,  Since July 2018, she had transient dizzy spells with associated rapid eye movement, difficult to focus, difficult to walk, lasting for a few minutes, also frequent bilateral frontal headaches, she had gradual worsening more frequent, longer lasting spells, was referred to ENT evaluation, during  the evaluation of vestibular functional test on August 08, 2017, she was noted to have long-lasting spontaneous nystagmus,  Since that event, she had more frequent dizziness, vertigo, nystagmus spells, on August 10, 2017, she has suffered most a prolonged severe episode, that prompted her emergency visit, CAT scan of the brain showed no significant abnormality,  MRI of brain at [facility] on August 11, 2017 showed numerous foci of DWI lesion in the left cerebellum, more than right cerebellum, also involving right posterior pons, periventricular T2 flair hyperintensity lesions, remote lacunar infarction in the basal ganglion.  She was put on aspirin, Plavix, she had no recurrent episode, continue has mild unsteady gait, tends to lean towards the left side,  CT angiogram of head and neck August 11, 2017 showed hypoplastic or occluded left A1 segment,   Echocardiogram is  normal,  Extensive laboratory evaluations, see activity 194 elevated, homocystine 9.9, C-reactive protein was elevated at 9.5, ESR was 30 glucose 133, normal TSH, free T4, CMP showed potassium 3.5, glucose 106, CPK 28, CBC showed elevated WBC 12.4, hemoglobin of 11.9, LDL was 149, vitamin D level was less than 4.2, vitamin B12 353, A1c 5.6,  REVIEW OF SYSTEMS: Full 14 system review of systems performed and notable only for as above  ALLERGIES: Allergies  Allergen Reactions  . Ace Inhibitors Cough  . Metformin     REACTION: extreme nausea/diarrhea  . Promethazine Other (See Comments)    IV Form causes "irritating veins"    HOME MEDICATIONS: Current Outpatient Medications  Medication Sig Dispense Refill  . amLODipine (NORVASC) 10 MG tablet TAKE 1 TABLET BY MOUTH EVERY DAY 30 tablet 0  . aspirin 81 MG tablet Take 81 mg by mouth daily.    . clopidogrel (PLAVIX) 75 MG tablet Take 75 mg by mouth daily.    Marland Kitchen glipiZIDE (GLUCOTROL) 5 MG tablet Take 5 mg by mouth daily.    Marland Kitchen lamoTRIgine (LAMICTAL) 100 MG tablet TAKE 1 TABLET BY MOUTH DAILY 90 tablet 0  . Liraglutide (VICTOZA [place]) Inject 1.8 mg into the skin daily.    Marland Kitchen LORazepam (ATIVAN) 1 MG tablet Take 1 tablet (1 mg total) by mouth at bedtime. 30 tablet 1  . losartan-hydrochlorothiazide (HYZAAR) 100-25 MG per tablet TAKE 1 TABLET BY MOUTH EVERY DAY 30 tablet 0  . metoprolol succinate (TOPROL-XL) 100 MG 24 hr tablet TAKE 1 TABLET BY MOUTH DAILY 30 tablet 3  . omeprazole (  PRILOSEC) 20 MG capsule Take 20 mg by mouth 2 (two) times daily.      . rosuvastatin (CRESTOR) 10 MG tablet Take 10 mg by mouth daily.    . sertraline (ZOLOFT) 25 MG tablet TAKE 1 TABLET BY MOUTH DAILY 90 tablet 0   No current facility-administered medications for this visit.     PAST MEDICAL HISTORY: Past Medical History:  Diagnosis Date  . Bipolar disorder ([place])   . Diabetes ([place])   . GERD (gastroesophageal reflux disease)   . HTN (hypertension)   . Hyperlipemia    . PCOS (polycystic ovarian syndrome)   . Stroke [facility])     PAST SURGICAL HISTORY: Past Surgical History:  Procedure Laterality Date  . ABDOMINAL HYSTERECTOMY    . LAPAROTOMY      FAMILY HISTORY: Family History  Problem Relation Age of Onset  . Diabetes Mother   . Hypertension Mother   . Hyperlipidemia Mother   . Stroke Mother   . Diabetes Father   . Hypertension Father     SOCIAL HISTORY:  Social History   Socioeconomic History  . Marital status: Married    Spouse name: Not on file  . Number of children: 0  . Years of education: some college  . Highest education level: High school graduate  Social Needs  . Financial resource strain: Not on file  . Food insecurity - worry: Not on file  . Food insecurity - inability: Not on file  . Transportation needs - medical: Not on file  . Transportation needs - non-medical: Not on file  Occupational History  . Occupation: Tech support  Tobacco Use  . Smoking status: Never Smoker  . Smokeless tobacco: Never Used  Substance and Sexual Activity  . Alcohol use: No  . Drug use: No  . Sexual activity: Yes  Other Topics Concern  . Not on file  Social History Narrative   Lives at home with husband.   Right-handed.   1 can of Coke Zero per day.     PHYSICAL EXAM   Vitals:   08/23/17 1420  BP: 108/70  Pulse: 77  Weight: 211 lb (95.7 kg)  Height: 5' 2.75" (1.594 m)    Not recorded      Body mass index is 37.68 kg/m.  PHYSICAL EXAMNIATION:  Gen: NAD, conversant, well nourised, obese, well groomed                     Cardiovascular: Regular rate rhythm, no peripheral edema, warm, nontender. Eyes: Conjunctivae clear without exudates or hemorrhage Neck: Supple, no carotid bruits. Pulmonary: Clear to auscultation bilaterally   NEUROLOGICAL EXAM:  MENTAL STATUS: Speech:    Speech is normal; fluent and spontaneous with normal comprehension.  Cognition:     Orientation to time, place and person     Normal  recent and remote memory     Normal Attention span and concentration     Normal Language, naming, repeating,spontaneous speech     Fund of knowledge   CRANIAL NERVES: CN II: Visual fields are full to confrontation. Fundoscopic exam is normal with sharp discs and no vascular changes. Pupils are round equal and briskly reactive to light. CN III, IV, VI: She has right INO, left beating nystagmus on left gaze. CN V: Facial sensation is intact to pinprick in all 3 divisions bilaterally. Corneal responses are intact.  CN VII: Face is symmetric with normal eye closure and smile. CN VIII: Hearing is normal to rubbing fingers  CN IX, X: Palate elevates symmetrically. Phonation is normal. CN XI: Head turning and shoulder shrug are intact CN XII: Tongue is midline with normal movements and no atrophy.  MOTOR: There is no pronator drift of out-stretched arms. Muscle bulk and tone are normal. Muscle strength is normal.  REFLEXES: Reflexes are 2+ and symmetric at the biceps, triceps, knees, and ankles. Plantar responses are flexor.  SENSORY: Intact to light touch, pinprick, positional sensation and vibratory sensation are intact in fingers and toes.  COORDINATION: She has mild truncal ataxia, tends to lean towards the left side, slight limb dysmetria, left side is more obvious,  GAIT/STANCE: Posture is normal. Gait is steady with normal steps, base, arm swing, and turning. Heel and toe walking.  She has difficulty with tandem walking Romberg is absent.   DIAGNOSTIC DATA (LABS, IMAGING, TESTING) - I reviewed patient records, labs, notes, testing and imaging myself where available.   ASSESSMENT AND PLAN  ARACELLI WOLOSZYN is a 51 y.o. female   Embolic stroke  Possible cardiac etiology, she is wearing 30 days cardiac monitoring,  Echocardiogram showed no significant structural abnormality, I will refer her to TEE,  Transcranial Doppler study  Continue aspirin, and Plavix 75 mg daily  Marcial Pacas, M.D. Ph.D.  [facility] [address], [place], [place] [address] Ph: [phone] Fax: [phone]  CC: Referring Provider

## 2017-09-15 ENCOUNTER — Ambulatory Visit (HOSPITAL_COMMUNITY)
Admission: RE | Admit: 2017-09-15 | Discharge: 2017-09-15 | Disposition: A | Discharge status: Home / Self Care | Payer: Commercial Managed Care - PPO | Source: Ambulatory Visit | Attending: Neurology | Admitting: Neurology

## 2017-09-15 DIAGNOSIS — R269 Unspecified abnormalities of gait and mobility: Secondary | ICD-10-CM

## 2017-09-15 DIAGNOSIS — I63113 Cerebral infarction due to embolism of bilateral vertebral arteries: Secondary | ICD-10-CM | POA: Diagnosis present

## 2017-09-15 NOTE — Progress Notes (Signed)
Transcranial Doppler completed. [facility], [place] 09/15/2017: 3:21 PM

## 2017-09-21 ENCOUNTER — Telehealth: Payer: Self-pay | Admitting: Neurology

## 2017-09-21 NOTE — Telephone Encounter (Signed)
Left message requesting a return call.

## 2017-09-21 NOTE — Telephone Encounter (Signed)
Please call patient, transcranial ultrasound showed evidence of diffuse intracranial atherosclerosis,   - Technically difficult due to poor windows. - Mildly elevated right middle and posterior cerebral artery as well as basilar and left posterior cerebral artery mean flow velocities of unclear significance. Low left middle cerebral artery mean flow velocities may suggest distal stenosis. Globally elevated pulsatility indexes may suggest diffuse intracranial atherosclerosis.

## 2017-09-21 NOTE — Telephone Encounter (Addendum)
Spoke to patient - she is aware of results and she will continue taking Aspirin, Plavix and Crestor.  She will keep her pending follow up.  Also, she stated that her cardiologist informed her there were no signs of a-fib while wearing her holter monitor.  She is waiting for an appt for the TEE.

## 2017-10-27 ENCOUNTER — Encounter: Payer: Self-pay | Admitting: Neurology

## 2017-10-27 ENCOUNTER — Ambulatory Visit: Payer: Commercial Managed Care - PPO | Admitting: Neurology

## 2017-10-27 ENCOUNTER — Encounter (INDEPENDENT_AMBULATORY_CARE_PROVIDER_SITE_OTHER): Payer: Self-pay

## 2017-10-27 VITALS — BP 149/91 | HR 81 | Ht 62.75 in | Wt 219.0 lb

## 2017-10-27 DIAGNOSIS — I63113 Cerebral infarction due to embolism of bilateral vertebral arteries: Secondary | ICD-10-CM

## 2017-10-27 MED ORDER — ASPIRIN EC 325 MG PO TBEC: 325.0000 mg | DELAYED_RELEASE_TABLET | Freq: Every day | ORAL | 4 refills | Status: AC

## 2017-10-27 NOTE — Progress Notes (Addendum)
PATIENT: Caroline Arnold DOB: 1966/08/24  Chief Complaint  Patient presents with  . Cerebrovascular Accident    States her cardiac monitoring did not show any a-fib events.  Says her cardiologist did not order TEE due to these results.  She has developed constant itching since starting Plavix.     HISTORICAL  Caroline Arnold is a 52 years old female, seen in refer by her primary care PA Windell Hummingbird, for evaluation of stroke, initial evaluation was on August 26, 2017.  She has history of hypertension, diabetes, hyperlipidemia, was not on any antiplatelet agent,  Since July 2018, she had transient dizzy spells with associated rapid eye movement, difficult to focus, difficult to walk, lasting for a few minutes, also frequent bilateral frontal headaches, she had gradual worsening more frequent, longer lasting spells, was referred to ENT evaluation, during  the evaluation of vestibular functional test on August 08, 2017, she was noted to have long-lasting spontaneous nystagmus,  Since that event, she had more frequent dizziness, vertigo, nystagmus spells, on August 10, 2017, she has suffered most a prolonged severe episode, that prompted her emergency visit, CAT scan of the brain showed no significant abnormality,  MRI of brain at [facility] on August 11, 2017 showed numerous foci of DWI lesion in the left cerebellum, more than right cerebellum, also involving right posterior pons, periventricular T2 flair hyperintensity lesions, remote lacunar infarction in the basal ganglion.  She was put on aspirin, Plavix, she had no recurrent episode, continue has mild unsteady gait, tends to lean towards the left side,  CT angiogram of head and neck August 11, 2017 showed hypoplastic or occluded left A1 segment,   Echocardiogram is normal,  Extensive laboratory evaluations, see activity 194 elevated, homocystine 9.9, C-reactive protein was elevated at 9.5, ESR was 30 glucose 133,  normal TSH, free T4, CMP showed potassium 3.5, glucose 106, CPK 28, CBC showed elevated WBC 12.4, hemoglobin of 11.9, LDL was 149, vitamin D level was less than 4.2, vitamin B12 353, A1c 5.6,  UPDATE Oct 27 2017: She is doing well, complains of itching sensation, she contributed it to Plavix, she was seen by cardiologist, I reviewed the note on October 13, 2017, 30-day cardiac monitoring showed no significant abnormality, ECHO was normal. TEE was not recommended.  REVIEW OF SYSTEMS: Full 14 system review of systems performed and notable only for as above  ALLERGIES: Allergies  Allergen Reactions  . Ace Inhibitors Cough  . Metformin     REACTION: extreme nausea/diarrhea  . Promethazine Other (See Comments)    IV Form causes "irritating veins"    HOME MEDICATIONS: Current Outpatient Medications  Medication Sig Dispense Refill  . amLODipine (NORVASC) 10 MG tablet TAKE 1 TABLET BY MOUTH EVERY DAY 30 tablet 0  . aspirin 81 MG tablet Take 81 mg by mouth daily.    . clopidogrel (PLAVIX) 75 MG tablet Take 1 tablet (75 mg total) by mouth daily. 90 tablet 3  . glipiZIDE (GLUCOTROL) 5 MG tablet Take 5 mg by mouth daily.    Marland Kitchen lamoTRIgine (LAMICTAL) 100 MG tablet TAKE 1 TABLET BY MOUTH DAILY 90 tablet 0  . Liraglutide (VICTOZA [place]) Inject 1.8 mg into the skin daily.    Marland Kitchen LORazepam (ATIVAN) 1 MG tablet Take 1 tablet (1 mg total) by mouth at bedtime. 30 tablet 1  . losartan-hydrochlorothiazide (HYZAAR) 100-25 MG per tablet TAKE 1 TABLET BY MOUTH EVERY DAY 30 tablet 0  . metoprolol succinate (TOPROL-XL) 100 MG  24 hr tablet TAKE 1 TABLET BY MOUTH DAILY 30 tablet 3  . omeprazole (PRILOSEC) 20 MG capsule Take 20 mg by mouth 2 (two) times daily.      . pravastatin (PRAVACHOL) 20 MG tablet Take by mouth.    . sertraline (ZOLOFT) 25 MG tablet TAKE 1 TABLET BY MOUTH DAILY 90 tablet 0   No current facility-administered medications for this visit.     PAST MEDICAL HISTORY: Past Medical History:  Diagnosis  Date  . Bipolar disorder ([place])   . Diabetes ([place])   . GERD (gastroesophageal reflux disease)   . HTN (hypertension)   . Hyperlipemia   . PCOS (polycystic ovarian syndrome)   . Stroke [facility])     PAST SURGICAL HISTORY: Past Surgical History:  Procedure Laterality Date  . ABDOMINAL HYSTERECTOMY    . LAPAROTOMY      FAMILY HISTORY: Family History  Problem Relation Age of Onset  . Diabetes Mother   . Hypertension Mother   . Hyperlipidemia Mother   . Stroke Mother   . Diabetes Father   . Hypertension Father     SOCIAL HISTORY:  Social History   Socioeconomic History  . Marital status: Married    Spouse name: Not on file  . Number of children: 0  . Years of education: some college  . Highest education level: High school graduate  Social Needs  . Financial resource strain: Not on file  . Food insecurity - worry: Not on file  . Food insecurity - inability: Not on file  . Transportation needs - medical: Not on file  . Transportation needs - non-medical: Not on file  Occupational History  . Occupation: Tech support  Tobacco Use  . Smoking status: Never Smoker  . Smokeless tobacco: Never Used  Substance and Sexual Activity  . Alcohol use: No  . Drug use: No  . Sexual activity: Yes  Other Topics Concern  . Not on file  Social History Narrative   Lives at home with husband.   Right-handed.   1 can of Coke Zero per day.     PHYSICAL EXAM   Vitals:   10/27/17 1629  BP: (!) 149/91  Pulse: 81  Weight: 219 lb (99.3 kg)  Height: 5' 2.75" (1.594 m)    Not recorded      Body mass index is 39.1 kg/m.  PHYSICAL EXAMNIATION:  Gen: NAD, conversant, well nourised, obese, well groomed                     Cardiovascular: Regular rate rhythm, no peripheral edema, warm, nontender. Eyes: Conjunctivae clear without exudates or hemorrhage Neck: Supple, no carotid bruits. Pulmonary: Clear to auscultation bilaterally   NEUROLOGICAL EXAM:  MENTAL STATUS: Speech:     Speech is normal; fluent and spontaneous with normal comprehension.  Cognition:     Orientation to time, place and person     Normal recent and remote memory     Normal Attention span and concentration     Normal Language, naming, repeating,spontaneous speech     Fund of knowledge   CRANIAL NERVES: CN II: Visual fields are full to confrontation. Fundoscopic exam is normal with sharp discs and no vascular changes. Pupils are round equal and briskly reactive to light. CN III, IV, VI: She has right INO, left beating nystagmus on left gaze. CN V: Facial sensation is intact to pinprick in all 3 divisions bilaterally. Corneal responses are intact.  CN VII: Face is symmetric with  normal eye closure and smile. CN VIII: Hearing is normal to rubbing fingers CN IX, X: Palate elevates symmetrically. Phonation is normal. CN XI: Head turning and shoulder shrug are intact CN XII: Tongue is midline with normal movements and no atrophy.  MOTOR: There is no pronator drift of out-stretched arms. Muscle bulk and tone are normal. Muscle strength is normal.  REFLEXES: Reflexes are 2+ and symmetric at the biceps, triceps, knees, and ankles. Plantar responses are flexor.  SENSORY: Intact to light touch, pinprick, positional sensation and vibratory sensation are intact in fingers and toes.  COORDINATION: She has no finger to nose, heel to shin dysmetria  GAIT/STANCE: She has mild tandem walking difficulty   DIAGNOSTIC DATA (LABS, IMAGING, TESTING) - I reviewed patient records, labs, notes, testing and imaging myself where available.   ASSESSMENT AND PLAN  ERSILIA BRAWLEY is a 52 y.o. female   Multiple brainstem stroke stroke  Possible cardiac embolic [id number],36 days cardiac monitoring showed no significant abnormality, is followed up by Cardiologist Dr. Gerarda Gunther, [place].   Echocardiogram showed no significant structural abnormality, I will refer her to TEE,  Transcranial Doppler study in December 2018  showed diffuse intracranial atherosclerosis  There is no definite evidence to support cardiac arrhythmia to justify anticoagulations at this point, she complains of itching with Plavix, will change her to aspirin 325 mg every day, continue follow-up with her cardiologist,  Return to clinic in 1 year  Marcial Pacas, M.D. Ph.D.  [facility] [address], [place], [place] [address] Ph: [phone] Fax: [phone]  CC: Referring Provider

## 2018-10-27 ENCOUNTER — Encounter: Payer: Commercial Managed Care - PPO | Admitting: Neurology

## 2018-10-27 ENCOUNTER — Ambulatory Visit (INDEPENDENT_AMBULATORY_CARE_PROVIDER_SITE_OTHER): Payer: Commercial Managed Care - PPO | Admitting: Neurology

## 2018-10-27 DIAGNOSIS — G5603 Carpal tunnel syndrome, bilateral upper limbs: Secondary | ICD-10-CM | POA: Diagnosis not present

## 2018-10-27 DIAGNOSIS — Z0289 Encounter for other administrative examinations: Secondary | ICD-10-CM

## 2018-10-27 NOTE — Procedures (Signed)
Full Name: Cabria Micalizzi Gender: Female MRN #: [id number] Date of Birth: November 21, 1965    Visit Date: 10/27/2018 12:05 Age: 53 Years 62 Months Old Examining Physician: Marcial Pacas, MD  Referring Physician: Windell Hummingbird, PA History:  53 years old female, with few months history of intermittent bilateral hands paresthesia,  Summary of the tests:  Nerve conduction study: Bilateral ulnar sensory and motor responses were normal. Right median sensory response was absent. Left median sensory response showed mildly decreased snap amplitude with moderately prolonged peak latency.  Bilateral median motor responses showed moderately prolonged distal latency, with normal C map amplitude, conduction velocity.  Electromyography: Selected needle examinations of bilateral upper extremity muscles, and left abductor digiti brevis showed no significant abnormality.   Conclusion: This is an abnormal study.  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  There is no evidence of axonal loss, demyelinating in nature.    Marcial Pacas, M.D.Ph.D.  [facility] [place], [place] [address] Tel: [phone] Fax: [phone]        [facility]    Nerve / Sites Muscle Latency Ref. Amplitude Ref. Rel Amp Segments Distance Velocity Ref. Area    ms ms mV mV %  cm m/s m/s mVms  R Median - APB     Wrist APB 6.1 ?4.4 8.6 ?4.0 100 Wrist - APB 7   27.2     Upper arm APB 9.8  8.1  93.6 Upper arm - Wrist 18 49 ?49 27.0  L Median - APB     Wrist APB 6.1 ?4.4 5.8 ?4.0 100 Wrist - APB 7   18.5     Upper arm APB 9.8  5.0  87.1 Upper arm - Wrist 18 49 ?49 17.4  R Ulnar - ADM     Wrist ADM 2.2 ?3.3 11.5 ?6.0 100 Wrist - ADM 7   28.3     B.Elbow ADM 4.9  11.0  95.1 B.Elbow - Wrist 15 56 ?49 29.5     A.Elbow ADM 6.7  10.6  96.8 A.Elbow - B.Elbow 10 56 ?49 28.9         A.Elbow - Wrist      L Ulnar - ADM   Wrist ADM 2.4 ?3.3 9.4 ?6.0 100 Wrist - ADM 7   25.5     B.Elbow ADM 5.1  9.0  95.2 B.Elbow - Wrist 14 53 ?49 25.0     A.Elbow ADM 7.1  8.4  93.5 A.Elbow - B.Elbow 10 49 ?49 24.3         A.Elbow - Wrist                 SNC    Nerve / Sites Rec. Site Peak Lat Ref.  Amp Ref. Segments Distance    ms ms V V  cm  R Median - Orthodromic (Dig II, Mid palm)     Dig II Wrist NR ?3.4 NR ?10 Dig II - Wrist 13  L Median - Orthodromic (Dig II, Mid palm)     Dig II Wrist 5.3 ?3.4 8 ?10 Dig II - Wrist 13  R Ulnar - Orthodromic, (Dig V, Mid p LAD lm)     Dig V Wrist 2.6 ?3.1 8 ?5 Dig V - Wrist 11  L Ulnar - Orthodromic, (Dig V, Mid palm)   showed no significantR. Pronator teres Normal None None None _______ Normal Normal Normal Normal  R.  Biceps brachii Normal None None None _______ Normal Normal Normal Normal  R. Deltoid Normal None None None _______ Normal Normal Normal Normal  R. Triceps brachii Normal None None None _______ Normal Normal Normal Normal  R. Extensor digitorum communis Normal None None None _______ Normal Normal Normal Normal  L. Abductor pollicis brevis Normal None None None _______ Normal Normal Normal Reduced

## 2018-10-30 ENCOUNTER — Encounter: Payer: Self-pay | Admitting: Neurology

## 2018-10-30 ENCOUNTER — Ambulatory Visit: Payer: Commercial Managed Care - PPO | Admitting: Neurology

## 2018-10-30 VITALS — BP 138/76 | HR 72 | Ht 62.75 in | Wt 221.0 lb

## 2018-10-30 DIAGNOSIS — I63113 Cerebral infarction due to embolism of bilateral vertebral arteries: Secondary | ICD-10-CM

## 2018-10-30 DIAGNOSIS — G5603 Carpal tunnel syndrome, bilateral upper limbs: Secondary | ICD-10-CM

## 2018-10-30 NOTE — Progress Notes (Signed)
PATIENT: Caroline Arnold DOB: 01/11/1966  Chief Complaint  Patient presents with  . Hx of CVA    She has continued taking aspirin 343m daily.  No new issues.  . Carpal Tunnel    She would like to further discuss the results of her NCV/EMG she completed on 10/27/2018.     HISTORICAL  Caroline KARGis a 53years old female, seen in refer by her primary care PA TWindell Hummingbird for evaluation of stroke, initial evaluation was on August 26, 2017.  She has history of hypertension, diabetes, hyperlipidemia, was not on any antiplatelet agent,  Since July 2018, she had transient dizzy spells with associated rapid eye movement, difficult to focus, difficult to walk, lasting for a few minutes, also frequent bilateral frontal headaches, she had gradual worsening more frequent, longer lasting spells, was referred to ENT evaluation, during  the evaluation of vestibular functional test on August 08, 2017, she was noted to have long-lasting spontaneous nystagmus,  Since that event, she had more frequent dizziness, vertigo, nystagmus spells, on August 10, 2017, she has suffered most a prolonged severe episode, that prompted her emergency visit, CAT scan of the brain showed no significant abnormality,  [facility] showed numerous foci of DWI lesion in the left cerebellum, more than right cerebellum, also involving right posterior pons, periventricular T2 flair hyperintensity lesions, remote lacunar infarction in the basal ganglion.  She was put on aspirin, Plavix, she had no recurrent episode, continue has mild unsteady gait, tends to lean towards the left side,  CT angiogram of head and neck August 11, 2017 showed hypoplastic or occluded left A1 segment,   Echocardiogram is normal,  Extensive laboratory evaluations, see activity 194 elevated, homocystine 9.9, C-reactive protein was elevated at 9.5, ESR was 30 glucose 133, normal TSH, free T4, CMP  showed potassium 3.5, glucose 106, CPK 28, CBC showed elevated WBC 12.4, hemoglobin of 11.9, LDL was 149, vitamin D level was less than 4.2, vitamin B12 353, A1c 5.6,  UPDATE Oct 27 2017: She is doing well, complains of itching sensation, she contributed it to Plavix, she was seen by cardiologist, I reviewed the note on October 13, 2017, 30-day cardiac monitoring showed no significant abnormality, ECHO was normal. TEE was not recommended.  UPDATE Oct 30 2018: She had EMG nerve conduction study on October 27, 2018, which showed evidence of moderate bilateral carpal tunnel syndromes.  She continue complains of intermittent bilateral hand paresthesia, to the point of deep achy sensation, usually triggered by raising arm overhead, improved by put arm down, she denied worsening gait abnormality,  She was recently diagnosed with vitamin B12 deficiency begin B12 supplement, she denies neck pain,  REVIEW OF SYSTEMS: Full 14 system review of systems performed and notable only for as above  ALLERGIES: Allergies  Allergen Reactions  . Ace Inhibitors Cough  . Metformin     REACTION: extreme nausea/diarrhea  . Promethazine Other (See Comments)    IV Form causes "irritating veins"    HOME MEDICATIONS: Current Outpatient Medications  Medication Sig Dispense Refill  . amLODipine (NORVASC) 10 MG tablet TAKE 1 TABLET BY MOUTH EVERY DAY 30 tablet 0  . aspirin EC 325 MG tablet Take 1 tablet (325 mg total) by mouth daily. 90 tablet 4  . glipiZIDE (GLUCOTROL) 5 MG tablet Take 5 mg by mouth daily.    .Marland KitchenlamoTRIgine (LAMICTAL) 100 MG tablet TAKE 1 TABLET BY MOUTH DAILY 90  tablet 0  . Liraglutide (VICTOZA  [place]) Inject 1.8 mg into the skin daily.    Marland Kitchen LORazepam (ATIVAN) 1 MG tablet Take 1 tablet (1 mg total) by mouth at bedtime. 30 tablet 1  . losartan-hydrochlorothiazide (HYZAAR) 100-25 MG per tablet TAKE 1 TABLET BY MOUTH EVERY DAY 30 tablet 0  . metoprolol succinate (TOPROL-XL) 100 MG 24 hr tablet TAKE 1 TABLET  BY MOUTH DAILY 30 tablet 3  . omeprazole (PRILOSEC) 20 MG capsule Take 20 mg by mouth 2 (two) times daily.      . pravastatin (PRAVACHOL) 20 MG tablet Take by mouth.    . sertraline (ZOLOFT) 25 MG tablet TAKE 1 TABLET BY MOUTH DAILY 90 tablet 0   No current facility-administered medications for this visit.     PAST MEDICAL HISTORY: Past Medical History:  Diagnosis Date  . Bipolar disorder ([place])   . Diabetes ([place])   . GERD (gastroesophageal reflux disease)   . HTN (hypertension)   . Hyperlipemia   . PCOS (polycystic ovarian syndrome)   . Stroke [facility])     PAST SURGICAL HISTORY: Past Surgical History:  Procedure Laterality Date  . ABDOMINAL HYSTERECTOMY    . LAPAROTOMY      FAMILY HISTORY: Family History  Problem Relation Age of Onset  . Diabetes Mother   . Hypertension Mother   . Hyperlipidemia Mother   . Stroke Mother   . Diabetes Father   . Hypertension Father     SOCIAL HISTORY:  Social History   Socioeconomic History  . Marital status: Married    Spouse name: Not on file  . Number of children: 0  . Years of education: some college  . Highest education level: High school graduate  Occupational History  . Occupation: Tech support  Social Needs  . Financial resource strain: Not on file  . Food insecurity:    Worry: Not on file    Inability: Not on file  . Transportation needs:    Medical: Not on file    Non-medical: Not on file  Tobacco Use  . Smoking status: Never Smoker  . Smokeless tobacco: Never Used  Substance and Sexual Activity  . Alcohol use: No  . Drug use: No  . Sexual activity: Yes  Lifestyle  . Physical activity:    Days per week: Not on file    Minutes per session: Not on file  . Stress: Not on file  Relationships  . Social connections:    Talks on phone: Not on file    Gets together: Not on file    Attends religious service: Not on file    Active member of club or organization: Not on file    Attends meetings of clubs or  organizations: Not on file    Relationship status: Not on file  . Intimate partner violence:    Fear of current or ex partner: Not on file    Emotionally abused: Not on file    Physically abused: Not on file    Forced sexual activity: Not on file  Other Topics Concern  . Not on file  Social History Narrative   Lives at home with husband.   Right-handed.   1 can of Coke Zero per day.     PHYSICAL EXAM   Vitals:   10/30/18 1555  BP: 138/76  Pulse: 72  Weight: 221 lb (100.2 kg)  Height: 5' 2.75" (1.594 m)    Not recorded      Body mass index is  39.46 kg/m.  PHYSICAL EXAMNIATION:  Gen: NAD, conversant, well nourised, obese, well groomed                     Cardiovascular: Regular rate rhythm, no peripheral edema, warm, nontender. Eyes: Conjunctivae clear without exudates or hemorrhage Neck: Supple, no carotid bruits. Pulmonary: Clear to auscultation bilaterally   NEUROLOGICAL EXAM:  MENTAL STATUS: Speech:    Speech is normal; fluent and spontaneous with normal comprehension.  Cognition:     Orientation to time, place and person     Normal recent and remote memory     Normal Attention span and concentration     Normal Language, naming, repeating,spontaneous speech     Fund of knowledge   CRANIAL NERVES: CN II: Visual fields are full to confrontation. Fundoscopic exam is normal with sharp discs and no vascular changes. Pupils are round equal and briskly reactive to light. CN III, IV, VI: Extraocular movements were normal CN V: Facial sensation is intact to pinprick in all 3 divisions bilaterally. Corneal responses are intact.  CN VII: Face is symmetric with normal eye closure and smile. CN VIII: Hearing is normal to rubbing fingers CN IX, X: Palate elevates symmetrically. Phonation is normal. CN XI: Head turning and shoulder shrug are intact CN XII: Tongue is midline with normal movements and no atrophy.  MOTOR: There is no pronator drift of out-stretched  arms. Muscle bulk and tone are normal. Muscle strength is normal.  REFLEXES: Reflexes are 2+ and symmetric at the biceps, triceps, knees, and ankles. Plantar responses are flexor.  SENSORY: Intact to light touch, pinprick, positional sensation and vibratory sensation are intact in fingers and toes.  COORDINATION: She has no finger to nose, heel to shin dysmetria  GAIT/STANCE: She has mild tandem walking difficulty   DIAGNOSTIC DATA (LABS, IMAGING, TESTING) - I reviewed patient records, labs, notes, testing and imaging myself where available.   ASSESSMENT AND PLAN  FAUSTINA GEBERT is a 53 y.o. female   Multiple brainstem stroke stroke  cardiac monitoring showed no significant abnormality, is followed up by Cardiologist Dr. Gerarda Gunther, [place].   Echocardiogram showed no significant structural abnormality,  Transcranial Doppler study in December 2018 showed diffuse intracranial atherosclerosis  There is no definite evidence to support cardiac arrhythmia to justify anticoagulations at this point, she complains of itching with Plavix, was changed to aspirin 325 mg every day, continue follow-up with her cardiologist,  Bilateral carpal tunnel syndromes  Suggested wrist splint,  Marcial Pacas, M.D. Ph.D.  [facility] [address], [place], [place] [address] Ph: [phone] Fax: [phone]  CC: Referring Provider
